# Patient Record
Sex: Female | Born: 1960 | Race: White | Hispanic: No | Marital: Single | Smoking: Current every day smoker
Health system: Southern US, Community
[De-identification: ages and names within clinical notes are randomized; demographics above are authoritative.]

## PROBLEM LIST (undated history)

## (undated) DIAGNOSIS — E785 Hyperlipidemia, unspecified: Secondary | ICD-10-CM

## (undated) DIAGNOSIS — E079 Disorder of thyroid, unspecified: Secondary | ICD-10-CM

## (undated) DIAGNOSIS — T7840XA Allergy, unspecified, initial encounter: Secondary | ICD-10-CM

## (undated) DIAGNOSIS — J45909 Unspecified asthma, uncomplicated: Secondary | ICD-10-CM

## (undated) DIAGNOSIS — F419 Anxiety disorder, unspecified: Secondary | ICD-10-CM

## (undated) HISTORY — DX: Hyperlipidemia, unspecified: E78.5

## (undated) HISTORY — DX: Disorder of thyroid, unspecified: E07.9

## (undated) HISTORY — PX: MOUTH SURGERY: SHX715

## (undated) HISTORY — DX: Anxiety disorder, unspecified: F41.9

## (undated) HISTORY — DX: Unspecified asthma, uncomplicated: J45.909

## (undated) HISTORY — DX: Allergy, unspecified, initial encounter: T78.40XA

---

## 2010-07-21 ENCOUNTER — Encounter: Admission: RE | Admit: 2010-07-21 | Discharge: 2010-07-21 | Payer: Self-pay | Admitting: Internal Medicine

## 2010-07-25 ENCOUNTER — Encounter: Admission: RE | Admit: 2010-07-25 | Discharge: 2010-07-25 | Payer: Self-pay | Admitting: Internal Medicine

## 2011-06-21 ENCOUNTER — Other Ambulatory Visit: Payer: Self-pay | Admitting: Emergency Medicine

## 2011-06-21 DIAGNOSIS — Z1231 Encounter for screening mammogram for malignant neoplasm of breast: Secondary | ICD-10-CM

## 2011-07-23 ENCOUNTER — Ambulatory Visit
Admission: RE | Admit: 2011-07-23 | Discharge: 2011-07-23 | Disposition: A | Discharge status: Home / Self Care | Payer: BC Managed Care – PPO | Source: Ambulatory Visit | Attending: Emergency Medicine | Admitting: Emergency Medicine

## 2011-07-23 DIAGNOSIS — Z1231 Encounter for screening mammogram for malignant neoplasm of breast: Secondary | ICD-10-CM

## 2011-07-25 ENCOUNTER — Other Ambulatory Visit: Payer: Self-pay | Admitting: Emergency Medicine

## 2011-07-25 DIAGNOSIS — R928 Other abnormal and inconclusive findings on diagnostic imaging of breast: Secondary | ICD-10-CM

## 2011-08-01 ENCOUNTER — Ambulatory Visit
Admission: RE | Admit: 2011-08-01 | Discharge: 2011-08-01 | Disposition: A | Discharge status: Home / Self Care | Payer: BC Managed Care – PPO | Source: Ambulatory Visit | Attending: Emergency Medicine | Admitting: Emergency Medicine

## 2011-08-01 DIAGNOSIS — R928 Other abnormal and inconclusive findings on diagnostic imaging of breast: Secondary | ICD-10-CM

## 2012-02-03 ENCOUNTER — Ambulatory Visit (INDEPENDENT_AMBULATORY_CARE_PROVIDER_SITE_OTHER): Payer: BC Managed Care – PPO | Admitting: Physician Assistant

## 2012-02-03 VITALS — BP 131/82 | HR 76 | Temp 98.5°F | Resp 16 | Ht 66.0 in | Wt 158.0 lb

## 2012-02-03 DIAGNOSIS — Z23 Encounter for immunization: Secondary | ICD-10-CM

## 2012-02-03 DIAGNOSIS — J301 Allergic rhinitis due to pollen: Secondary | ICD-10-CM

## 2012-02-03 DIAGNOSIS — J45909 Unspecified asthma, uncomplicated: Secondary | ICD-10-CM

## 2012-02-03 MED ORDER — BECLOMETHASONE DIPROPIONATE 80 MCG/ACT IN AERS: 1.0000 | INHALATION_SPRAY | Freq: Two times a day (BID) | RESPIRATORY_TRACT | Status: DC

## 2012-02-03 NOTE — Patient Instructions (Signed)
Use Pro Air regularly Add QVar 80. Call if symptoms worsen

## 2012-02-03 NOTE — Progress Notes (Signed)
  Subjective:    Patient ID: Julie Nixon, female    DOB: 07-01-1961, 51 y.o.   MRN: [id number]  HPI Julie Nixon comes in today with 2 days of head congestion and significant rhinorrhea.  Has used her regular allergy meds which has helped.  Concerned it will settle in her chest and flare her asthma. No current wheezing or SOB Restarted smoking in Dec.  Smoking 1/2 ppd  Also, needs #3 Hep B to complete series   Review of Systems  Constitutional: Negative for fever and fatigue.  HENT: Positive for congestion, rhinorrhea, sneezing and postnasal drip. Negative for sore throat.   Respiratory: Positive for cough. Negative for chest tightness.   Musculoskeletal: Negative for myalgias.  Neurological: Positive for headaches.       Objective:   Physical Exam  Constitutional: She appears well-developed and well-nourished.  HENT:  Right Ear: Tympanic membrane normal.  Left Ear: Tympanic membrane normal.  Nose: Mucosal edema and rhinorrhea present.  Mouth/Throat: Oropharynx is clear and moist.  Cardiovascular: Normal rate and regular rhythm.   Pulmonary/Chest: Effort normal and breath sounds normal.          Assessment & Plan:   1. Asthma  beclomethasone (QVAR) 80 MCG/ACT inhaler  2. Need for prophylactic vaccination and inoculation against viral hepatitis  Hepatitis B vaccine adult IM  3. Allergic rhinitis due to pollen     Call if symptoms worsen

## 2012-03-06 ENCOUNTER — Ambulatory Visit (INDEPENDENT_AMBULATORY_CARE_PROVIDER_SITE_OTHER): Payer: BC Managed Care – PPO | Admitting: Physician Assistant

## 2012-03-06 VITALS — BP 160/82 | HR 70 | Temp 98.1°F | Resp 16 | Ht 66.0 in | Wt 158.0 lb

## 2012-03-06 DIAGNOSIS — J019 Acute sinusitis, unspecified: Secondary | ICD-10-CM

## 2012-03-06 DIAGNOSIS — R509 Fever, unspecified: Secondary | ICD-10-CM

## 2012-03-06 LAB — POCT CBC
Granulocyte percent: 41.5 %G (ref 37–80)
HCT, POC: 39.3 % (ref 37.7–47.9)
Hemoglobin: 12.6 g/dL (ref 12.2–16.2)
MPV: 9.5 fL (ref 0–99.8)
POC Granulocyte: 3.8 (ref 2–6.9)
POC LYMPH PERCENT: 49.5 %L (ref 10–50)
POC MID %: 9 %M (ref 0–12)
RBC: 4.24 M/uL (ref 4.04–5.48)
RDW, POC: 12.4 %
WBC: 9.2 10*3/uL (ref 4.6–10.2)

## 2012-03-06 MED ORDER — AMOXICILLIN 875 MG PO TABS: ORAL_TABLET | ORAL | Status: AC

## 2012-03-06 NOTE — Progress Notes (Signed)
  Subjective:    Patient ID: Julie Nixon, female    DOB: 1961-01-15, 51 y.o.   MRN: [id number]  HPI 4 days of fatigue, runny nose with thick drainage with neti pot.  Feverish last night with sweats this AM. Myalgias and a little cough.  Some wheezing but she is using her inhalers which is helping.  Review of Systems  Constitutional: Positive for fever, chills and fatigue.  HENT: Positive for congestion and rhinorrhea. Negative for ear pain and sore throat.   Respiratory: Positive for cough and wheezing. Negative for shortness of breath.   Musculoskeletal: Positive for myalgias.  Neurological: Positive for light-headedness. Negative for headaches.       Objective:   Physical Exam  HENT:  Right Ear: Tympanic membrane normal.  Left Ear: Tympanic membrane normal.  Nose: Mucosal edema and rhinorrhea present.  Mouth/Throat: Oropharynx is clear and moist.  Cardiovascular: Normal rate and regular rhythm.   Pulmonary/Chest: Effort normal and breath sounds normal.  Skin: Skin is warm and dry.     Results for orders placed in visit on 03/06/12  POCT CBC      Component Value Range   WBC 9.2  4.6 - 10.2 (K/uL)   Lymph, poc 4.6 (*) 0.6 - 3.4    POC LYMPH PERCENT 49.5  10 - 50 (%L)   MID (cbc) 0.8  0 - 0.9    POC MID % 9.0  0 - 12 (%M)   POC Granulocyte 3.8  2 - 6.9    Granulocyte percent 41.5  37 - 80 (%G)   RBC 4.24  4.04 - 5.48 (M/uL)   Hemoglobin 12.6  12.2 - 16.2 (g/dL)   HCT, POC 08.6  57.8 - 47.9 (%)   MCV 92.6  80 - 97 (fL)   MCH, POC 29.7  27 - 31.2 (pg)   MCHC 32.1  31.8 - 35.4 (g/dL)   RDW, POC 46.9     Platelet Count, POC 239  142 - 424 (K/uL)   MPV 9.5  0 - 99.8 (fL)       Assessment & Plan:  Sinusitis  Amoxicillin, use nasal sprays Call if sx worsen

## 2012-07-08 ENCOUNTER — Ambulatory Visit (INDEPENDENT_AMBULATORY_CARE_PROVIDER_SITE_OTHER): Payer: BC Managed Care – PPO | Admitting: Physician Assistant

## 2012-07-08 ENCOUNTER — Encounter: Payer: Self-pay | Admitting: Physician Assistant

## 2012-07-08 VITALS — BP 126/79 | HR 70 | Temp 97.1°F | Resp 16 | Ht 66.0 in | Wt 152.0 lb

## 2012-07-08 DIAGNOSIS — Z0000 Encounter for general adult medical examination without abnormal findings: Secondary | ICD-10-CM

## 2012-07-08 LAB — CBC WITH DIFFERENTIAL/PLATELET
Basophils Absolute: 0.1 10*3/uL (ref 0.0–0.1)
Basophils Relative: 1 % (ref 0–1)
HCT: 43.2 % (ref 36.0–46.0)
MCH: 31.3 pg (ref 26.0–34.0)
MCV: 90.2 fL (ref 78.0–100.0)
Monocytes Absolute: 0.6 10*3/uL (ref 0.1–1.0)
Monocytes Relative: 10 % (ref 3–12)
RBC: 4.79 MIL/uL (ref 3.87–5.11)
RDW: 13.2 % (ref 11.5–15.5)
WBC: 5.6 10*3/uL (ref 4.0–10.5)

## 2012-07-08 LAB — POCT URINALYSIS DIPSTICK
Blood, UA: NEGATIVE
Glucose, UA: NEGATIVE
Leukocytes, UA: NEGATIVE
Protein, UA: 30
Urobilinogen, UA: 0.2

## 2012-07-08 NOTE — Progress Notes (Signed)
  Subjective:    Patient ID: Julie Nixon, female    DOB: 04-08-1961, 51 y.o.   MRN: [id number]  HPI Mckensi comes in today for her annual exam.  She has no specific concerns.  She would like to try the electronic cigarette to help with smoking cessation.  She has lost weight and is pleased with her efforts with diet changes and exercise.  She mentions that she had a period for the first time since November in June 2013.  She had 3 cycles in 2012.  She has not scheduled her MMG yet but is due.     Review of Systems  Constitutional: Negative.   HENT: Negative.   Eyes: Negative.   Respiratory: Negative.   Cardiovascular: Negative.   Gastrointestinal: Negative.   Genitourinary: Positive for vaginal bleeding.  Musculoskeletal: Negative.   Neurological: Negative.   Psychiatric/Behavioral: Negative.        Objective:   Physical Exam  Constitutional: She is oriented to person, place, and time. Vital signs are normal. She appears well-developed and well-nourished.  HENT:  Right Ear: Tympanic membrane normal.  Left Ear: Tympanic membrane normal.  Mouth/Throat: Oropharynx is clear and moist.  Eyes: Conjunctivae, EOM and lids are normal. Pupils are equal, round, and reactive to light.  Neck: Normal range of motion. No mass and no thyromegaly present.  Cardiovascular: Normal rate and regular rhythm.   Pulmonary/Chest: Effort normal and breath sounds normal.  Abdominal: Normal appearance and bowel sounds are normal. There is no tenderness.  Genitourinary: Vagina normal and uterus normal. No breast swelling, tenderness, discharge or bleeding. Cervix exhibits no motion tenderness and no discharge. Right adnexum displays no mass, no tenderness and no fullness. Left adnexum displays no mass, no tenderness and no fullness.  Lymphadenopathy:    She has no cervical adenopathy.    She has no axillary adenopathy.  Neurological: She is alert and oriented to person, place, and time. She has normal  strength and normal reflexes.  Skin: Skin is warm.  Psychiatric: She has a normal mood and affect.          Assessment & Plan:  Annual Screening Exam Nicotine Addicition Allergic Rhinitis Asthma  CBC, CMET, Lipid, TSH, Pap #1, Vit D Encouraged her to continue lifestyle changes Encouraged smoking cessation She [facility]

## 2012-07-09 ENCOUNTER — Other Ambulatory Visit: Payer: Self-pay | Admitting: Emergency Medicine

## 2012-07-09 DIAGNOSIS — Z1231 Encounter for screening mammogram for malignant neoplasm of breast: Secondary | ICD-10-CM

## 2012-07-09 LAB — COMPREHENSIVE METABOLIC PANEL
AST: 18 U/L (ref 0–37)
Alkaline Phosphatase: 49 U/L (ref 39–117)
BUN: 11 mg/dL (ref 6–23)
CO2: 25 mEq/L (ref 19–32)
Creat: 0.74 mg/dL (ref 0.50–1.10)
Glucose, Bld: 86 mg/dL (ref 70–99)
Total Bilirubin: 0.5 mg/dL (ref 0.3–1.2)
Total Protein: 6.8 g/dL (ref 6.0–8.3)

## 2012-07-09 LAB — TSH: TSH: 1.528 u[IU]/mL (ref 0.350–4.500)

## 2012-07-09 LAB — LIPID PANEL
Cholesterol: 264 mg/dL — ABNORMAL HIGH (ref 0–200)
Triglycerides: 126 mg/dL (ref <150)
VLDL: 25 mg/dL (ref 0–40)

## 2012-07-10 LAB — PAP IG (IMAGE GUIDED)

## 2012-07-16 ENCOUNTER — Telehealth: Payer: Self-pay

## 2012-07-16 MED ORDER — ATORVASTATIN CALCIUM 10 MG PO TABS: ORAL_TABLET | ORAL | Status: DC

## 2012-07-16 NOTE — Telephone Encounter (Signed)
Pt CB after receiving an unable to reach letter about her labs. Discussed labs and instr's to pt and updated her contact info. Pt agreed to try Lipitor and RTC for lipid recheck in 6-8 weeks. Sent in Rx for Lipitor 10 as written in lab results by [place] to [employer] and [facility].

## 2012-07-21 ENCOUNTER — Telehealth: Payer: Self-pay

## 2012-07-21 NOTE — Telephone Encounter (Signed)
Patient has already spoken to someone about her labs, but she would like a copy of her labs mailed to her.

## 2012-07-21 NOTE — Telephone Encounter (Signed)
Mailed pt copy of her labs 

## 2012-08-04 ENCOUNTER — Ambulatory Visit
Admission: RE | Admit: 2012-08-04 | Discharge: 2012-08-04 | Disposition: A | Discharge status: Home / Self Care | Payer: BC Managed Care – PPO | Source: Ambulatory Visit | Attending: Emergency Medicine | Admitting: Emergency Medicine

## 2012-08-04 DIAGNOSIS — Z1231 Encounter for screening mammogram for malignant neoplasm of breast: Secondary | ICD-10-CM

## 2012-08-06 ENCOUNTER — Telehealth: Payer: Self-pay

## 2012-08-06 NOTE — Telephone Encounter (Signed)
Given her family history, getting her cholesterol down is very important.    D/C the lipitor x 2 weeks.  If the myalgias resolve, restart the lipitor.  If the myalgias recur, then D/C and call back for instructions.  If the myalgias do not resolve, RTC to evaluate the cause.  We can try other cholesterol lowering medications, and OTC fish oil as well if needed.

## 2012-08-06 NOTE — Telephone Encounter (Signed)
Pt started Lipitor 10 mg on 7/17 and now she is having soreness with her muscles, using [facility] [address]. Wants to know if she can take suppliments instead. I told her it depends on what her lab values are but I will call back.

## 2012-08-06 NOTE — Telephone Encounter (Signed)
The patient would like to have return call to discuss side effects of Lipitor.  The patient states she is have muscle pain and soreness.  Please call the patient to discuss symptoms at [phone].

## 2012-08-06 NOTE — Telephone Encounter (Signed)
Spoke to her and she was advised.

## 2012-09-03 ENCOUNTER — Ambulatory Visit (INDEPENDENT_AMBULATORY_CARE_PROVIDER_SITE_OTHER): Payer: BC Managed Care – PPO | Admitting: Physician Assistant

## 2012-09-03 VITALS — BP 137/82 | HR 81 | Temp 98.0°F | Resp 16 | Ht 66.5 in | Wt 151.0 lb

## 2012-09-03 DIAGNOSIS — R454 Irritability and anger: Secondary | ICD-10-CM

## 2012-09-03 DIAGNOSIS — N951 Menopausal and female climacteric states: Secondary | ICD-10-CM

## 2012-09-03 MED ORDER — CLONAZEPAM 0.5 MG PO TABS: 0.2500 mg | ORAL_TABLET | Freq: Two times a day (BID) | ORAL | Status: DC | PRN

## 2012-09-03 MED ORDER — VENLAFAXINE HCL ER 37.5 MG PO CP24: 37.5000 mg | ORAL_CAPSULE | Freq: Every day | ORAL | Status: DC

## 2012-09-03 NOTE — Progress Notes (Signed)
  Subjective:    Patient ID: Julie Nixon, female    DOB: 13-Mar-1961, 51 y.o.   MRN: [id number]  HPI Pt presents to clinic with irritability.  She has been having problems with irritability for a while but today it really got bad.  She feels like this is all related to menopause. She has not had a period since May.  She is short tempered, tearful and emotional which is not normal for her.  She is having hot flashes and waking up at night drenched in sweat but she is still sleeping ok.  She feels like she is a really strong person but this is getting to her.  She forgets things that she has done forever and that makes her really mad.  She has dealt with this for months but she just feels like she needs some medication help.  Review of Systems  Psychiatric/Behavioral: Positive for disturbed wake/sleep cycle (vasomotor night sweats) and decreased concentration. Negative for self-injury. The patient is nervous/anxious.        Objective:   Physical Exam  Constitutional: She is oriented to person, place, and time. She appears well-developed and well-nourished.       Tearful at times  HENT:  Head: Normocephalic and atraumatic.  Right Ear: External ear normal.  Left Ear: External ear normal.  Eyes: Conjunctivae are normal.  Pulmonary/Chest: Effort normal.  Neurological: She is alert and oriented to person, place, and time.  Skin: Skin is warm and dry.  Psychiatric: She has a normal mood and affect. Her behavior is normal. Judgment and thought content normal.          Assessment & Plan:   1. Menopausal syndrome   2. Irritable mood   3. Hot flashes, menopausal    Spent 30 minutes with patient discussing her current symptoms.  Pt very hesitant for hormonal therapy due to strong family hx of cardiac disease.  D/w pt her options and we felt like Effexor with rescue meds for anxiety might be the best treatment.  Pt will RTC in 1 month for recheck of both irritability and cholesterol.  At  that time we may have to increase her Effexor dose.  She will use Klonopin prn and hope to be off of that in several months.

## 2012-10-11 ENCOUNTER — Ambulatory Visit (INDEPENDENT_AMBULATORY_CARE_PROVIDER_SITE_OTHER): Payer: BC Managed Care – PPO | Admitting: Emergency Medicine

## 2012-10-11 ENCOUNTER — Other Ambulatory Visit: Payer: BC Managed Care – PPO

## 2012-10-11 ENCOUNTER — Other Ambulatory Visit: Payer: Self-pay | Admitting: Emergency Medicine

## 2012-10-11 VITALS — BP 124/87 | HR 60 | Temp 97.5°F | Resp 16 | Ht 66.5 in | Wt 149.0 lb

## 2012-10-11 DIAGNOSIS — E785 Hyperlipidemia, unspecified: Secondary | ICD-10-CM

## 2012-10-11 LAB — COMPREHENSIVE METABOLIC PANEL
Albumin: 4.4 g/dL (ref 3.5–5.2)
Alkaline Phosphatase: 47 U/L (ref 39–117)
BUN: 12 mg/dL (ref 6–23)
Calcium: 9.3 mg/dL (ref 8.4–10.5)
Chloride: 103 mEq/L (ref 96–112)
Creat: 0.73 mg/dL (ref 0.50–1.10)
Glucose, Bld: 87 mg/dL (ref 70–99)
Total Protein: 6.7 g/dL (ref 6.0–8.3)

## 2012-10-11 LAB — LIPID PANEL
Cholesterol: 139 mg/dL (ref 0–200)
Total CHOL/HDL Ratio: 3 Ratio

## 2012-10-11 NOTE — Progress Notes (Signed)
[facility] [address] [place] [address] [phone]  Date:  10/11/2012   Name:  Julie Nixon   DOB:  1961/09/28   MRN:  [id number]  PCP:  Tally Due, MD    Chief Complaint: Follow-up   History of Present Illness:  Julie Nixon is a 51 y.o. very pleasant female patient who presents with the following: she is here for labs, needs lipid panel for Lipitor. Had myalgia from this medication, d/c for 2 weeks. Then resumed medication. Myalgias have not returned. States she feels fine.     There is no problem list on file for this patient.   No past medical history on file.  No past surgical history on file.  History  Substance Use Topics  . Smoking status: Former Smoker -- 1.0 packs/day for 31 years    Types: Cigarettes    Quit date: 08/06/2012  . Smokeless tobacco: Not on file   Comment: trying to quit  . Alcohol Use: No    No family history on file.  No Known Allergies  Medication list has been reviewed and updated.  Current Outpatient Prescriptions on File Prior to Visit  Medication Sig Dispense Refill  . atorvastatin (LIPITOR) 10 MG tablet Take one tablet by mouth every night.  90 tablet  0  . fexofenadine-pseudoephedrine (ALLEGRA-D 24) 180-240 MG per 24 hr tablet Take 1 tablet by mouth daily.      Marland Kitchen venlafaxine XR (EFFEXOR XR) 37.5 MG 24 hr capsule Take 1 capsule (37.5 mg total) by mouth daily.  30 capsule  0  . azelastine (ASTELIN) 137 MCG/SPRAY nasal spray Place 1 spray into the nose daily. Use in each nostril as directed      . beclomethasone (QVAR) 80 MCG/ACT inhaler Inhale 1 puff into the lungs 2 (two) times daily.  1 Inhaler  2  . clonazePAM (KLONOPIN) 0.5 MG tablet Take 0.5-1 tablets (0.25-0.5 mg total) by mouth 2 (two) times daily as needed for anxiety.  30 tablet  0  . fluticasone (FLONASE) 50 MCG/ACT nasal spray Place 2 sprays into the nose daily.        Review of Systems:  As per HPI, otherwise negative.      Physical Examination: Filed Vitals:   10/11/12 0930  BP: 124/87  Pulse: 60  Temp: 97.5 F (36.4 C)  Resp: 16   Filed Vitals:   10/11/12 0930  Height: 5' 6.5" (1.689 m)  Weight: 149 lb (67.586 kg)   Body mass index is 23.69 kg/(m^2). Ideal Body Weight: Weight in (lb) to have BMI = 25: 156.9    GEN: WDWN, NAD, Non-toxic, Alert & Oriented x 3 HEENT: Atraumatic, Normocephalic.  Ears and Nose: No external deformity. EXTR: No clubbing/cyanosis/edema NEURO: Normal gait.  PSYCH: Normally interactive. Conversant. Not depressed or anxious appearing.  Calm demeanor.    Assessment and Plan: Elevated cholesterol Labs Reviewed non-pharmaceutic measures to reduce lipid levels Follow up in six months Offered referral to 104 and declined.  Littrell, Amy Wall, RT  I have reviewed and agree with documentation. Robert P. Merla Riches, M.D.

## 2012-10-12 ENCOUNTER — Other Ambulatory Visit: Payer: Self-pay | Admitting: Physician Assistant

## 2012-10-13 ENCOUNTER — Other Ambulatory Visit: Payer: Self-pay | Admitting: Emergency Medicine

## 2012-10-13 MED ORDER — ATORVASTATIN CALCIUM 40 MG PO TABS: 40.0000 mg | ORAL_TABLET | Freq: Every day | ORAL | Status: DC

## 2012-10-14 ENCOUNTER — Telehealth: Payer: Self-pay | Admitting: Family Medicine

## 2012-10-14 NOTE — Telephone Encounter (Signed)
(  See labs) Spoke to pt and read message Dr Dareen Piano wrote, gave her results and she did not understand at all why this was necessary. So I reviewed the labs-and range- and I told her that I would need to speak with PA/ or Anderson in regards to this considering her #'s came back in a normal range setting.

## 2012-10-15 ENCOUNTER — Encounter: Payer: Self-pay | Admitting: Emergency Medicine

## 2012-10-15 NOTE — Telephone Encounter (Signed)
Pt notified and copy sent 

## 2012-10-15 NOTE — Telephone Encounter (Signed)
Labs are normal no need for further action.  Thanks.  Please let patient know

## 2012-10-21 ENCOUNTER — Other Ambulatory Visit: Payer: Self-pay | Admitting: *Deleted

## 2012-10-21 MED ORDER — ATORVASTATIN CALCIUM 10 MG PO TABS: 10.0000 mg | ORAL_TABLET | Freq: Every day | ORAL | Status: DC

## 2012-11-15 ENCOUNTER — Other Ambulatory Visit: Payer: Self-pay | Admitting: Physician Assistant

## 2012-12-10 ENCOUNTER — Ambulatory Visit (INDEPENDENT_AMBULATORY_CARE_PROVIDER_SITE_OTHER): Payer: BC Managed Care – PPO | Admitting: Family Medicine

## 2012-12-10 VITALS — BP 166/83 | HR 72 | Temp 97.9°F | Resp 16 | Ht 67.0 in | Wt 160.2 lb

## 2012-12-10 DIAGNOSIS — J309 Allergic rhinitis, unspecified: Secondary | ICD-10-CM

## 2012-12-10 DIAGNOSIS — L259 Unspecified contact dermatitis, unspecified cause: Secondary | ICD-10-CM

## 2012-12-10 MED ORDER — METHYLPREDNISOLONE ACETATE 80 MG/ML IJ SUSP
80.0000 mg | Freq: Once | INTRAMUSCULAR | Status: AC
  Administered 2012-12-10: 80 mg via INTRAMUSCULAR

## 2012-12-10 NOTE — Patient Instructions (Signed)

## 2012-12-10 NOTE — Progress Notes (Signed)
Chief complaint: Worsening allergies as well as spot on back.  History of present illness: 51 year old female with significant past medical history of seasonal allergies having worsening allergies recently. 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 Patient states that she's been taking her Allegra-D as well as a nasal spray a regular basis. Patient states that she had worsening rhinorrhea as well as a rash that occurred on her nose patient redness on her face as well. Patient denies any shortness of breath any chest pain any palpitations fevers chills or recent illnesses.   in addition this patient also has an area on her lower back she would like looked at. Patient states that this has popped up as well over the course of the last few days. Patient states that she says it itches and once again denies any warmness any discharge and denies any radiation of the pain.  Past medical history, social, surgical and family history all reviewed.   Denies fever, chills, nausea vomiting abdominal pain, dysuria, chest pain, shortness of breath dyspnea on exertion or numbness in extremities  Physical exam Blood pressure 166/83, pulse 72, temperature 97.9 F (36.6 C), temperature source Oral, resp. rate 16, height 5\' 7"  (1.702 m), weight 160 lb 3.2 oz (72.666 kg), last menstrual period 05/03/2012, SpO2 99.00%. General: Patient does have some mild pressure speech. No apparent distress Respiratory: Clear to auscultation bilaterally Cardiovascular: Regular in rhythm no murmur Abdomen: Bowel sounds positive nontender nondistended Extremities: Capillary refill brisk, she is neurovascularly intact in all activities. Back exam: Patient skin does have a area that corresponds to we'll in his mid back. There is also some dry eczema that she has on her arms in the flexor regions. On patient's face she does have some mild macular areas underneath her  nose but there is no oral mucosa involvement.  Assessment: Allergic rhinitis with exacerbation Eczema Pressured speech  Plan: Patient was given a shot of 80 mg of Depo-Medrol today. Patient will continue to take her antihistamine for a regular basis. For her eczema patient was given handouts and told to use Aveeno or Eucerin on a regular basis twice a day. Encourage her to monitor diet and likely secondary to her allergic rhinitis as well. Discuss with her pressured speech and being somewhat in a potential hypomanic state. Patient has been taking Effexor for the last 3 months at a very low dose for menopausal symptoms. Patient will followup with primary care provider to discuss if this seems to be getting a way of her regular activities. Patient's screening exam today was negative though for bipolar.

## 2012-12-20 ENCOUNTER — Telehealth: Payer: Self-pay

## 2012-12-20 ENCOUNTER — Other Ambulatory Visit: Payer: Self-pay | Admitting: Physician Assistant

## 2012-12-20 NOTE — Telephone Encounter (Signed)
The patient called to request refill of her Effexor 37.5 mg rx.  The patient stated she asked her pharmacy to refill the medication on 12/14/12 and they stated last night (12/19/12) that they had no response from [facility].  The patient stated she is almost out of her medication.  Please call the patient to update on status at [phone].

## 2012-12-21 MED ORDER — VENLAFAXINE HCL ER 37.5 MG PO CP24: 37.5000 mg | ORAL_CAPSULE | Freq: Every day | ORAL | Status: DC

## 2012-12-21 NOTE — Telephone Encounter (Signed)
Refills sent. Notify patient.

## 2012-12-21 NOTE — Telephone Encounter (Signed)
Pt is calling checking on status of her refill request the original was sent to [facility] on 12/14/12 per patient

## 2012-12-21 NOTE — Telephone Encounter (Signed)
lmom that rx has been sent into pharmacy

## 2013-02-03 ENCOUNTER — Ambulatory Visit (INDEPENDENT_AMBULATORY_CARE_PROVIDER_SITE_OTHER): Payer: BC Managed Care – PPO | Admitting: Physician Assistant

## 2013-02-03 VITALS — BP 144/80 | HR 75 | Temp 98.0°F | Resp 17 | Ht 66.5 in | Wt 163.0 lb

## 2013-02-03 DIAGNOSIS — M25462 Effusion, left knee: Secondary | ICD-10-CM

## 2013-02-03 DIAGNOSIS — M25469 Effusion, unspecified knee: Secondary | ICD-10-CM

## 2013-02-03 DIAGNOSIS — R21 Rash and other nonspecific skin eruption: Secondary | ICD-10-CM

## 2013-02-03 MED ORDER — TRIAMCINOLONE ACETONIDE 0.5 % EX CREA: TOPICAL_CREAM | Freq: Two times a day (BID) | CUTANEOUS | Status: DC

## 2013-02-03 NOTE — Progress Notes (Signed)
[address], [place] [address]   Phone [phone]  Subjective:    Patient ID: Julie Nixon, female    DOB: 1961-05-22, 52 y.o.   MRN: [id number]  HPI Pt presents to clinic with 2 concerns 1- L knee feels tight since yesterday when she squatted down and it felt funny.  She did not feel a pop,click or grind and has had no pain since it occurred.  Does feel like her knee is a little swollen.  She has never had a knee injury.  She has taken no medications or done ice, she just wants to make sure it is ok.  She is about 15 heavier than she was last year and is trying to get that down - she has had no change in her diet but is suffering from menopausal symptoms 2- area on her back that she has had for months that is itchy raised and red - she does not know what caused it and does not have it anywhere else - she has used from OTC hydrocortisone cream every once in a while.  She has h/o severe allergies and skin irritation.  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   Review of Systems  Constitutional: Negative for fever and chills.  Musculoskeletal: Positive for joint swelling (L knee without injury). Negative for gait problem.  Skin: Positive for rash.       Objective:   Physical Exam  Vitals reviewed. Constitutional: She is oriented to person, place, and time. She appears well-developed and well-nourished.  HENT:  Head: Normocephalic and atraumatic.  Right Ear: External ear normal.  Left Ear: External ear normal.  Pulmonary/Chest: Effort normal.  Musculoskeletal:       Left knee: She exhibits swelling and effusion. She exhibits normal range of motion, no ecchymosis, no deformity, no laceration, no erythema and no LCL laxity. no tenderness found. No medial joint line, no lateral joint line, no MCL, no LCL  and no patellar tendon tenderness noted.       Slight swelling noticed around patella.  Otherwise normal exam.  Neurological: She is alert and oriented to person, place, and time.  Skin: Skin is warm and dry. Rash noted.       Rash over vertebral bony prominence that sticks out the most above waist line and below bra line.  Multiple erythematous papules that are excoriated.  No signs of infection.  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  Psychiatric: She has a normal mood and affect. Her behavior is normal. Judgment and thought content normal.      Assessment & Plan:   1. Rash of back  triamcinolone cream (KENALOG) 0.5 %  2. 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 2- pt will watch and wait and use OTC NSAIDs - due to no bony tenderness and no pain will not do an xray at this time.  Pt to use ice and not heat.  Probably related to full knee extension and increased weight recently.  She can continue to workout as long as she is not load bearing on her knees.

## 2013-05-16 ENCOUNTER — Ambulatory Visit (INDEPENDENT_AMBULATORY_CARE_PROVIDER_SITE_OTHER): Payer: BC Managed Care – PPO | Admitting: Emergency Medicine

## 2013-05-16 VITALS — BP 118/86 | HR 68 | Temp 98.0°F | Resp 16 | Ht 67.5 in | Wt 165.8 lb

## 2013-05-16 DIAGNOSIS — F17200 Nicotine dependence, unspecified, uncomplicated: Secondary | ICD-10-CM

## 2013-05-16 DIAGNOSIS — R5383 Other fatigue: Secondary | ICD-10-CM

## 2013-05-16 DIAGNOSIS — E785 Hyperlipidemia, unspecified: Secondary | ICD-10-CM

## 2013-05-16 DIAGNOSIS — IMO0001 Reserved for inherently not codable concepts without codable children: Secondary | ICD-10-CM

## 2013-05-16 DIAGNOSIS — Z79899 Other long term (current) drug therapy: Secondary | ICD-10-CM

## 2013-05-16 DIAGNOSIS — R5381 Other malaise: Secondary | ICD-10-CM

## 2013-05-16 LAB — LIPID PANEL
Cholesterol: 161 mg/dL (ref 0–200)
HDL: 59 mg/dL (ref >39)
Total CHOL/HDL Ratio: 2.7 Ratio
Triglycerides: 86 mg/dL (ref <150)

## 2013-05-16 LAB — COMPREHENSIVE METABOLIC PANEL
Alkaline Phosphatase: 48 U/L (ref 39–117)
BUN: 14 mg/dL (ref 6–23)
CO2: 27 mEq/L (ref 19–32)
Creat: 0.67 mg/dL (ref 0.50–1.10)
Glucose, Bld: 89 mg/dL (ref 70–99)
Total Bilirubin: 0.6 mg/dL (ref 0.3–1.2)

## 2013-05-16 LAB — POCT CBC
Granulocyte percent: 50.7 %G (ref 37–80)
MCH, POC: 31 pg (ref 27–31.2)
MCHC: 32.2 g/dL (ref 31.8–35.4)
MID (cbc): 0.6 (ref 0–0.9)
MPV: 8.9 fL (ref 0–99.8)
POC LYMPH PERCENT: 39.1 %L (ref 10–50)
POC MID %: 10.2 %M (ref 0–12)
RBC: 4.78 M/uL (ref 4.04–5.48)
RDW, POC: 11.9 %

## 2013-05-16 LAB — T4, FREE: Free T4: 1.78 ng/dL (ref 0.80–1.80)

## 2013-05-16 LAB — TSH: TSH: 0.091 u[IU]/mL — ABNORMAL LOW (ref 0.350–4.500)

## 2013-05-16 LAB — CK: Total CK: 87 U/L (ref 7–177)

## 2013-05-16 NOTE — Progress Notes (Signed)
  Subjective:    Patient ID: Julie Nixon, female    DOB: Jan 20, 1961, 52 y.o.   MRN: [id number]  HPI  52 year old female here with soreness all over but not all the time primarily in the am.  Taking ibuprofen and it eases it up.  Has a lot of weight gain.  Thinking it is medicine for hot flashes, effexor.  Was on lipitor in July and started feeling bad.  Went off of it for two weeks and felt better.  Went back on it and didn't have trouble until about a month ago.  Muscle pain is not as severe as it was in the beginning.  Been off of cigarettes for nine months.  Been doing the e cigarette.  Started oil pulling, rinse mouth 20 minutes a day with oil for allergies.  Concerned about cholesterol.      Review of Systems     Objective:   Physical Exam HEENT exam is unremarkable. Neck is supple. Thyroid is not enlarged. Chest is clear to both auscultation and percussion. Cardiac exam reveals a regular rate without murmurs rubs or gallops. Abdomen is soft nontender  Results for orders placed in visit on 05/16/13  POCT CBC      Result Value Range   WBC 5.9  4.6 - 10.2 K/uL   Lymph, poc 2.3  0.6 - 3.4   POC LYMPH PERCENT 39.1  10 - 50 %L   MID (cbc) 0.6  0 - 0.9   POC MID % 10.2  0 - 12 %M   POC Granulocyte 3.0  2 - 6.9   Granulocyte percent 50.7  37 - 80 %G   RBC 4.78  4.04 - 5.48 M/uL   Hemoglobin 14.8  12.2 - 16.2 g/dL   HCT, POC 65.7  84.6 - 47.9 %   MCV 96.3  80 - 97 fL   MCH, POC 31.0  27 - 31.2 pg   MCHC 32.2  31.8 - 35.4 g/dL   RDW, POC 96.2     Platelet Count, POC 264  142 - 424 K/uL   MPV 8.9  0 - 99.8 fL        Assessment & Plan:  Communication on her last visit regarding her Lipitor. I think we will go ahead and check her lipids today and see where she stands with this. She's concerned about weight gain but has been able to stop smoking. I'm going ahead and decreasing her Lipitor by half if her lipid panel is reasonable .

## 2013-05-17 ENCOUNTER — Telehealth: Payer: Self-pay

## 2013-05-17 NOTE — Telephone Encounter (Signed)
Pt called and stated that Dr. Cleta Alberts did not send in her rx's for Astelin, albuterol, and Effexor. She will be out of the Effexor tom evening.  Dr. Cleta Alberts is out of the office tom 05/17/13 but can we refill these for her.  She was seen on 05/16/13

## 2013-05-18 MED ORDER — AZELASTINE HCL 0.1 % NA SOLN: 1.0000 | Freq: Every day | NASAL | Status: DC

## 2013-05-18 MED ORDER — ALBUTEROL SULFATE HFA 108 (90 BASE) MCG/ACT IN AERS: 2.0000 | INHALATION_SPRAY | Freq: Four times a day (QID) | RESPIRATORY_TRACT | Status: DC | PRN

## 2013-05-18 MED ORDER — VENLAFAXINE HCL ER 37.5 MG PO CP24: 37.5000 mg | ORAL_CAPSULE | Freq: Every day | ORAL | Status: DC

## 2013-05-18 NOTE — Telephone Encounter (Signed)
Sent to pharmacy 

## 2013-07-07 ENCOUNTER — Other Ambulatory Visit: Payer: Self-pay

## 2013-07-07 DIAGNOSIS — Z1231 Encounter for screening mammogram for malignant neoplasm of breast: Secondary | ICD-10-CM

## 2013-08-05 ENCOUNTER — Ambulatory Visit
Admission: RE | Admit: 2013-08-05 | Discharge: 2013-08-05 | Disposition: A | Discharge status: Home / Self Care | Payer: BC Managed Care – PPO | Source: Ambulatory Visit

## 2013-08-05 DIAGNOSIS — Z1231 Encounter for screening mammogram for malignant neoplasm of breast: Secondary | ICD-10-CM

## 2013-08-19 ENCOUNTER — Ambulatory Visit (INDEPENDENT_AMBULATORY_CARE_PROVIDER_SITE_OTHER): Payer: BC Managed Care – PPO | Admitting: Physician Assistant

## 2013-08-19 VITALS — BP 122/82 | HR 81 | Temp 98.5°F | Resp 18 | Ht 66.5 in | Wt 164.0 lb

## 2013-08-19 DIAGNOSIS — B354 Tinea corporis: Secondary | ICD-10-CM

## 2013-08-19 DIAGNOSIS — J309 Allergic rhinitis, unspecified: Secondary | ICD-10-CM

## 2013-08-19 MED ORDER — KETOCONAZOLE 2 % EX CREA: TOPICAL_CREAM | Freq: Three times a day (TID) | CUTANEOUS | Status: DC

## 2013-08-19 NOTE — Patient Instructions (Addendum)
Continue to use Lotrimin anti-fungal cream on your chin Get OTC Allegra without the decongestant to help decrease itching Continue to take your regular allergy medication and consider talking to your PCP about taking Singulair

## 2013-08-19 NOTE — Progress Notes (Signed)
I have examined this patient along with the student and agree.  

## 2013-08-19 NOTE — Progress Notes (Signed)
  Subjective:    Patient ID: Julie Nixon, female    DOB: 1961-08-18, 52 y.o.   MRN: [id number]  HPI 52 y.o. Female presents to clinic today for rash on her chin. She is unsure of when it exactly started. She states that it began as just a small dry area. It has gotten larger and progressively itchy and dry. She has tried putting  Kenalog cream without any relief. She noticed yesterday morning that it was circular and she started to freak out a little because she realized it was probably ring worm on her face so she went to store and got OCT Lotrimin. She has used the Lotrimin yesterday and today, which she states has just made it more itchy.  She states that she always has rashes because she has very sensitive skin and she figured that it was just related to her allergies. 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   Review of Systems  All other systems reviewed and are negative.       Objective:   Physical Exam  Nursing note and vitals reviewed. Constitutional: She is oriented to person, place, and time. Vital signs are normal. She appears well-developed and well-nourished. No distress.  HENT:  Head: Normocephalic and atraumatic.  Right Ear: External ear normal.  Left Ear: External ear normal.  Nose: Nose normal.  Eyes: Conjunctivae and lids are normal.  Neck: Trachea normal and normal range of motion. Neck supple. No thyromegaly present.  Cardiovascular: Normal rate, regular rhythm and normal heart sounds.   Pulmonary/Chest: Effort normal and breath sounds normal.  Musculoskeletal: Normal range of motion.  Lymphadenopathy:    She has no cervical adenopathy.  Neurological: She is alert and oriented to person, place, and time.  Skin: Skin is warm, dry and intact. Rash noted. Rash is macular. She is not diaphoretic. No pallor.  Circumferential rash on chin with central clearing, erythema, and scaling   Psychiatric: She has a normal mood and affect. Her behavior is normal. Judgment and thought content normal.       Assessment & Plan:  Tinea corporis - Plan: ketoconazole (NIZORAL) 2 % cream  Instructed patient to use Nizoral cream on tinea Corporis. Instructed her that she should continue to take her daily allergy medications. Discussed with her taking Allegra without the decongestant to help with itching.  She may follow up as needed.

## 2013-09-13 ENCOUNTER — Other Ambulatory Visit: Payer: Self-pay | Admitting: Emergency Medicine

## 2013-09-22 ENCOUNTER — Encounter: Payer: Self-pay | Admitting: Emergency Medicine

## 2013-09-22 ENCOUNTER — Ambulatory Visit (INDEPENDENT_AMBULATORY_CARE_PROVIDER_SITE_OTHER): Payer: BC Managed Care – PPO | Admitting: Emergency Medicine

## 2013-09-22 VITALS — BP 110/66 | HR 79 | Temp 98.3°F | Resp 16 | Ht 66.0 in | Wt 160.8 lb

## 2013-09-22 DIAGNOSIS — J309 Allergic rhinitis, unspecified: Secondary | ICD-10-CM

## 2013-09-22 DIAGNOSIS — Z1211 Encounter for screening for malignant neoplasm of colon: Secondary | ICD-10-CM

## 2013-09-22 DIAGNOSIS — Z136 Encounter for screening for cardiovascular disorders: Secondary | ICD-10-CM

## 2013-09-22 DIAGNOSIS — J45909 Unspecified asthma, uncomplicated: Secondary | ICD-10-CM

## 2013-09-22 DIAGNOSIS — F17200 Nicotine dependence, unspecified, uncomplicated: Secondary | ICD-10-CM | POA: Insufficient documentation

## 2013-09-22 DIAGNOSIS — Z0000 Encounter for general adult medical examination without abnormal findings: Secondary | ICD-10-CM

## 2013-09-22 DIAGNOSIS — Z780 Asymptomatic menopausal state: Secondary | ICD-10-CM | POA: Insufficient documentation

## 2013-09-22 DIAGNOSIS — E785 Hyperlipidemia, unspecified: Secondary | ICD-10-CM

## 2013-09-22 LAB — COMPREHENSIVE METABOLIC PANEL
AST: 16 U/L (ref 0–37)
Albumin: 3.9 g/dL (ref 3.5–5.2)
BUN: 12 mg/dL (ref 6–23)
Calcium: 9 mg/dL (ref 8.4–10.5)
Chloride: 107 mEq/L (ref 96–112)
Potassium: 4.2 mEq/L (ref 3.5–5.3)
Sodium: 138 mEq/L (ref 135–145)
Total Protein: 6.4 g/dL (ref 6.0–8.3)

## 2013-09-22 LAB — POCT URINALYSIS DIPSTICK
Bilirubin, UA: NEGATIVE
Glucose, UA: NEGATIVE
Nitrite, UA: NEGATIVE
pH, UA: 5.5

## 2013-09-22 LAB — CBC WITH DIFFERENTIAL/PLATELET
Basophils Absolute: 0 10*3/uL (ref 0.0–0.1)
HCT: 39.2 % (ref 36.0–46.0)
Hemoglobin: 13.4 g/dL (ref 12.0–15.0)
Lymphocytes Relative: 37 % (ref 12–46)
Monocytes Absolute: 0.7 10*3/uL (ref 0.1–1.0)
Monocytes Relative: 14 % — ABNORMAL HIGH (ref 3–12)
Neutro Abs: 1.6 10*3/uL — ABNORMAL LOW (ref 1.7–7.7)
Neutrophils Relative %: 31 % — ABNORMAL LOW (ref 43–77)
RDW: 12.5 % (ref 11.5–15.5)
WBC: 5.1 10*3/uL (ref 4.0–10.5)

## 2013-09-22 LAB — LIPID PANEL
HDL: 51 mg/dL (ref >39)
LDL Cholesterol: 63 mg/dL (ref 0–99)

## 2013-09-22 MED ORDER — BECLOMETHASONE DIPROPIONATE 80 MCG/ACT IN AERS: 1.0000 | INHALATION_SPRAY | Freq: Two times a day (BID) | RESPIRATORY_TRACT | Status: DC

## 2013-09-22 MED ORDER — VENLAFAXINE HCL ER 37.5 MG PO CP24: ORAL_CAPSULE | ORAL | Status: DC

## 2013-09-22 MED ORDER — FLUTICASONE PROPIONATE 50 MCG/ACT NA SUSP: 2.0000 | Freq: Every day | NASAL | Status: DC

## 2013-09-22 MED ORDER — ALBUTEROL SULFATE HFA 108 (90 BASE) MCG/ACT IN AERS: 2.0000 | INHALATION_SPRAY | Freq: Four times a day (QID) | RESPIRATORY_TRACT | Status: DC | PRN

## 2013-09-22 MED ORDER — AZELASTINE HCL 0.1 % NA SOLN: 2.0000 | Freq: Two times a day (BID) | NASAL | Status: DC

## 2013-09-22 MED ORDER — FEXOFENADINE-PSEUDOEPHED ER 180-240 MG PO TB24: 1.0000 | ORAL_TABLET | Freq: Every day | ORAL | Status: AC

## 2013-09-22 MED ORDER — ATORVASTATIN CALCIUM 10 MG PO TABS: 10.0000 mg | ORAL_TABLET | Freq: Every day | ORAL | Status: DC

## 2013-09-22 NOTE — Progress Notes (Deleted)
  Subjective:    Patient ID: Julie Nixon, female    DOB: 11-12-1961, 52 y.o.   MRN: [id number]  HPI Patient here for annual exam. Papsmear current 2013. Patient complains of menopausal symptoms. Hot flashes, bad days with no reason. Patient exercises regularly, minium 3 days per week. Family history of heart disease.    Review of Systems     Objective:   Physical Exam        Assessment & Plan:

## 2013-09-22 NOTE — Progress Notes (Signed)
  Subjective:    Patient ID: Julie Nixon, female    DOB: 1961/02/04, 52 y.o.   MRN: [id number]  HPI    Review of Systems  Constitutional: Negative.   HENT: Positive for rhinorrhea and sinus pressure.   Eyes: Negative.   Respiratory: Positive for chest tightness.   Cardiovascular: Negative.   Gastrointestinal: Negative.   Endocrine: Negative.   Genitourinary: Negative.   Musculoskeletal: Negative.   Skin: Negative.   Allergic/Immunologic: Negative.   Neurological: Negative.   Hematological: Negative.   Psychiatric/Behavioral: Negative.    she has allergies this time year and has some chest tightness for which she uses her inhaler. She also has significant nasal congestion postnasal drip. For this she uses Allegra-D and Astelin to She has significant menopausal symptoms but these are improved with use of the Effexor.      Objective:   Physical Exam HEENT exam pupils are equal and reactive to light TMs are clear nose normal except for some postnasal drip. Throat is normal. Neck is supple without adenopathy chest is clear to both auscultation and percussion heart is regular rate without murmurs abdomen soft nontender extremities without cyanosis clubbing or edema.  EKG no acute changes      Assessment & Plan:  Referral made to cardiology because a positive family history of intermittent smoking. She is also scheduled for screening colonoscopy. All meds were refilled. She will continue on her Effexor she uses for menopausal symptoms. She declined a flu shot today.

## 2013-09-23 ENCOUNTER — Other Ambulatory Visit: Payer: Self-pay | Admitting: Emergency Medicine

## 2013-09-23 DIAGNOSIS — E0590 Thyrotoxicosis, unspecified without thyrotoxic crisis or storm: Secondary | ICD-10-CM

## 2013-09-23 LAB — TSH: TSH: <0.008 u[IU]/mL — ABNORMAL LOW (ref 0.350–4.500)

## 2013-09-26 LAB — IFOBT (OCCULT BLOOD): IFOBT: NEGATIVE

## 2013-09-27 ENCOUNTER — Telehealth: Payer: Self-pay

## 2013-09-27 NOTE — Telephone Encounter (Signed)
Patient is returning our phone call regarding her lab results.    Best#: [phone]

## 2013-09-27 NOTE — Telephone Encounter (Signed)
See labs 

## 2013-10-22 ENCOUNTER — Encounter: Payer: Self-pay | Admitting: Internal Medicine

## 2013-10-22 ENCOUNTER — Ambulatory Visit (INDEPENDENT_AMBULATORY_CARE_PROVIDER_SITE_OTHER): Payer: BC Managed Care – PPO | Admitting: Internal Medicine

## 2013-10-22 VITALS — BP 144/92 | HR 66 | Ht 66.0 in | Wt 165.6 lb

## 2013-10-22 DIAGNOSIS — Z139 Encounter for screening, unspecified: Secondary | ICD-10-CM

## 2013-10-22 DIAGNOSIS — Z8249 Family history of ischemic heart disease and other diseases of the circulatory system: Secondary | ICD-10-CM

## 2013-10-22 DIAGNOSIS — E785 Hyperlipidemia, unspecified: Secondary | ICD-10-CM

## 2013-10-22 DIAGNOSIS — F17200 Nicotine dependence, unspecified, uncomplicated: Secondary | ICD-10-CM

## 2013-10-22 NOTE — Progress Notes (Signed)
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 She is originally from [place] and is from a large family. Both of her appearance had heart disease in her father had his first heart attack in his 67s. Both her mother and father had bypass surgeries as well and are deceased. She has had a brother who died of a heart attack at age 32 and 61 other brothers who are living but have high blood pressure and abnormal cholesterol. In addition she has 3 sisters who have high blood pressure and abnormal cholesterol. Patient is strong family history, she is interested in assessing her cardiac risk. Just as a history of smoking and quit in 2011 for about one year but has intermittently continued to smoke since then. Other than that she takes Effexor for perimenopausal symptoms and Lipitor which was recently reduced to 5 mg daily due to myalgias. She recently had a physical with Dr. Andee Poles and was reportedly doing pretty well. She is physically active and exercises her regularly for weight control. The only other new finding was that she had a low TSH and appears that she may be hypothyroid. She is scheduled to see Dr. Everardo All with our endocrinology next week. She denies any cardiac symptoms such as chest pain, palpitations, dyspnea on exertion, orthopnea, PND, presyncope, syncope, or other associated symptoms.  PMHx:  Past Medical History  Diagnosis Date  . Anxiety   . Allergy   . Asthma   . Hyperlipidemia     Past Surgical History  Procedure Laterality Date  . Mouth surgery      FAMHx:  Family History  Problem Relation Age of Onset  . Hypertension Mother   . Heart Problems Mother     CABG  . Hypertension Father   . Heart Problems Father     CABG  . Hypertension Sister   . Heart Problems Sister   . Hypertension  Sister   . Heart Problems Sister   . Hypertension Sister   . Heart Problems Sister   . Heart disease Brother     SOCHx:   reports that she has been smoking Cigarettes.  She has a 31 pack-year smoking history. She has never used smokeless tobacco. She reports that she does not drink alcohol or use illicit drugs.  ALLERGIES:  No Known Allergies  ROS: A comprehensive review of systems was negative except for: Behavioral/Psych: positive for mood swings and perimenapausal symptoms  HOME MEDS: Current Outpatient Prescriptions  Medication Sig Dispense Refill  . albuterol (PROVENTIL HFA;VENTOLIN HFA) 108 (90 BASE) MCG/ACT inhaler Inhale 2 puffs into the lungs every 6 (six) hours as needed for wheezing.  18 g  2  . atorvastatin (LIPITOR) 10 MG tablet Take 1 tablet (10 mg total) by mouth daily.  90 tablet  3  . azelastine (ASTELIN) 137 MCG/SPRAY nasal spray Place 2 sprays into the nose 2 (two) times daily. Use in each nostril as directed  30 mL  11  . beclomethasone (QVAR) 80 MCG/ACT inhaler Inhale 1 puff into the lungs 2 (two) times daily as needed.      . fexofenadine-pseudoephedrine (ALLEGRA-D 24) 180-240 MG per 24 hr tablet Take 1 tablet by mouth daily.  30 tablet  11  . fluticasone (FLONASE) 50 MCG/ACT nasal spray Place 2 sprays into the nose  daily as needed.      Marland Kitchen ketoconazole (NIZORAL) 2 % cream Apply topically 3 (three) times daily as needed.      . triamcinolone cream (KENALOG) 0.5 % Apply topically 2 (two) times daily as needed. To rash on back      . venlafaxine XR (EFFEXOR-XR) 37.5 MG 24 hr capsule TAKE ONE CAPSULE BY MOUTH DAILY  30 capsule  11   No current facility-administered medications for this visit.    LABS/IMAGING: No results found for this or any previous visit (from the past 48 hour(s)). No results found.  VITALS: BP 144/92  Pulse 66  Ht 5\' 6"  (1.676 m)  Wt 165 lb 9.6 oz (75.116 kg)  BMI 26.74 kg/m2  LMP 05/03/2012  EXAM: General appearance: alert and no  distress Neck: no carotid bruit and no JVD Lungs: clear to auscultation bilaterally Heart: regular rate and rhythm, S1, S2 normal, no murmur, click, rub or gallop Abdomen: soft, non-tender; bowel sounds normal; no masses,  no organomegaly Extremities: extremities normal, atraumatic, no cyanosis or edema Pulses: 2+ and symmetric Skin: Skin color, texture, turgor normal. No rashes or lesions Neurologic: Grossly normal Psych: Pleasant, joking, affable  EKG: Normal sinus rhythm at 66, no ischemic changes  ASSESSMENT: 1. Strong family history of premature coronary disease 2. Dyslipidemia-on Lipitor, but intolerant to 10 mg or higher doses 3. Tobacco dependence  PLAN: 1.   Mrs. Despain is a very significant family history of cardiac disease and I believe is at high risk for developing coronary disease. In addition to dyslipidemia which she is recently treating, she has been a smoker and has recently restarted smoking. We talked for 4 minutes today about smoking cessation, and ways that she can work at quitting again when she is ready. She says she wants to set a quit date and has had success with nicotine patches in the past but not necessarily with Chantix.  I feel that she will need advanced lipid testing is there possibly is a strong genetic risk and aggressive lipid therapy may be beneficial for her. If she's not at goal strong family history with Lipitor, we may need to switch her to a different statin due to her intolerance of higher doses. Finally to better ascertain her risk status she is asymptomatic, I would recommend coronary calcium scoring. Given her a age of 47 and smoking history, it would be unlikely for her to have any premature coronary disease, however if she does have coronary calcium, this would argue for more aggressive risk factor modification, again especially in light of her family history.  I will bring her back to discuss results of her scan and her lipid profile. I will  make adjustments to her medications as necessary.  Thanks again for referring her.  Chrystie Nose, MD, [facility] Attending Cardiologist [facility]  HILTY,Kenneth C 10/22/2013, 2:29 PM

## 2013-10-22 NOTE — Patient Instructions (Addendum)
Dr. Rennis Golden has ordered a CT calcium score - will be done at [facility]. For [facility] providers to read  Your physician recommends that you return for lab work in the next few days. You will need to be fasting for this blood work.  Your physician recommends that you schedule a follow-up appointment after your test.

## 2013-10-26 ENCOUNTER — Encounter: Payer: Self-pay | Admitting: Endocrinology

## 2013-10-26 ENCOUNTER — Ambulatory Visit (INDEPENDENT_AMBULATORY_CARE_PROVIDER_SITE_OTHER): Payer: BC Managed Care – PPO | Admitting: Endocrinology

## 2013-10-26 VITALS — BP 110/70 | Temp 98.4°F | Ht 66.0 in | Wt 166.9 lb

## 2013-10-26 DIAGNOSIS — E0590 Thyrotoxicosis, unspecified without thyrotoxic crisis or storm: Secondary | ICD-10-CM

## 2013-10-26 NOTE — Progress Notes (Signed)
Subjective:    Patient ID: Julie Nixon, female    DOB: 10-17-1961, 52 y.o.   MRN: [id number]  HPI Pt was noted on routine labs a few mos ago to have abnormal TFT.  She says in retrospect, she has moderate weight gain, worst at the legs, and assoc anxiety. Past Medical History  Diagnosis Date  . Anxiety   . Allergy   . Asthma   . Hyperlipidemia     Past Surgical History  Procedure Laterality Date  . Mouth surgery      History   Social History  . Marital Status: Single    Spouse Name: N/A    Number of Children: N/A  . 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 Smoking status: Current Every Day Smoker -- 1.00 packs/day for 31 years    Types: Cigarettes    Last Attempt to Quit: 08/06/2012  . Smokeless tobacco: Never Used     Comment: trying to quit - on & off for 3 years - down to 1/2 ppd  . Alcohol Use: No  . Drug Use: No  . Sexual Activity: No   Other Topics Concern  . Not on file   Social History Narrative   Divorced. Education: [employer]. Exercise: Elliptical/swim 3 times a week for 30 minutes.    Current Outpatient Prescriptions on File Prior to Visit  Medication Sig Dispense Refill  . albuterol (PROVENTIL HFA;VENTOLIN HFA) 108 (90 BASE) MCG/ACT inhaler Inhale 2 puffs into the lungs every 6 (six) hours as needed for wheezing.  18 g  2  . atorvastatin (LIPITOR) 10 MG tablet Take 1 tablet (10 mg total) by mouth daily.  90 tablet  3  . azelastine (ASTELIN) 137 MCG/SPRAY nasal spray Place 2 sprays into the nose 2 (two) times daily. Use in each nostril as directed  30 mL  11  . beclomethasone (QVAR) 80 MCG/ACT inhaler Inhale 1 puff into the lungs 2 (two) times daily as needed.      . fexofenadine-pseudoephedrine (ALLEGRA-D 24) 180-240 MG per 24 hr tablet Take 1 tablet by mouth daily.  30 tablet  11  . venlafaxine XR (EFFEXOR-XR) 37.5 MG 24 hr capsule TAKE ONE CAPSULE BY MOUTH DAILY   30 capsule  11  . fluticasone (FLONASE) 50 MCG/ACT nasal spray Place 2 sprays into the nose daily as needed.      Marland Kitchen ketoconazole (NIZORAL) 2 % cream Apply topically 3 (three) times daily as needed.      . triamcinolone cream (KENALOG) 0.5 % Apply topically 2 (two) times daily as needed. To rash on back       No current facility-administered medications on file prior to visit.    Allergies  Allergen Reactions  . Egg Yolk    Family History  Problem Relation Age of Onset  . Hypertension Mother   . Heart Problems Mother     CABG  . Hypertension Father   . Heart Problems Father     CABG  . Hypertension Sister   . Heart Problems Sister   . Hypertension Sister   . Heart Problems Sister   . Hypertension Sister   . Heart Problems Sister   . Heart disease Brother   no thyroid problems. BP 110/70  Temp(Src) 98.4 F (36.9 C) (Oral)  Ht 5\' 6"  (1.676 m)  Wt 166 lb 14.4 oz (75.705 kg)  BMI 26.95 kg/m2  LMP 05/03/2012  Review of Systems denies fever, headache, hoarseness, double vision, palpitations, sob, diarrhea, polyuria, myalgias, excessive diaphoresis, numbness, tremor, easy bruising, and rhinorrhea.  She has flushing, and difficulty with concentration.  Her last menstrual period was 18 months ago.      Objective:   Physical Exam VS: see vs page GEN: no distress HEAD: head: no deformity eyes: no periorbital swelling, no proptosis external nose and ears are normal mouth: no lesion seen NECK: supple, thyroid is not enlarged.   CHEST WALL: no deformity LUNGS:  Clear to auscultation.   CV: reg rate and rhythm, no murmur ABD: abdomen is soft, nontender.  no hepatosplenomegaly.  not distended.  no hernia.   MUSCULOSKELETAL: muscle bulk and strength are grossly normal.  no obvious joint swelling.  gait is normal and steady EXTEMITIES: no deformity.  no ulcer on the feet.  feet are of normal color and temp.  no edema PULSES: dorsalis pedis intact bilat.  no carotid bruit NEURO:   cn 2-12 grossly intact.   readily moves all 4's.  sensation is intact to touch on the feet.  No tremor. SKIN:  Normal texture and temperature.  No rash or suspicious lesion is visible.  Not diaphoretic.   NODES:  None palpable at the neck.   PSYCH: alert, oriented x3.  Does not appear anxious nor depressed.  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 Amemorrhea: due to hyperthyroidism, or menopause.

## 2013-10-26 NOTE — Patient Instructions (Signed)

## 2013-11-04 ENCOUNTER — Encounter (HOSPITAL_COMMUNITY)
Admission: RE | Admit: 2013-11-04 | Discharge: 2013-11-04 | Disposition: A | Discharge status: Home / Self Care | Payer: BC Managed Care – PPO | Source: Ambulatory Visit | Attending: Endocrinology | Admitting: Endocrinology

## 2013-11-04 DIAGNOSIS — E0590 Thyrotoxicosis, unspecified without thyrotoxic crisis or storm: Secondary | ICD-10-CM | POA: Insufficient documentation

## 2013-11-05 ENCOUNTER — Encounter (HOSPITAL_COMMUNITY)
Admission: RE | Admit: 2013-11-05 | Discharge: 2013-11-05 | Disposition: A | Discharge status: Home / Self Care | Payer: BC Managed Care – PPO | Source: Ambulatory Visit | Attending: Endocrinology | Admitting: Endocrinology

## 2013-11-05 ENCOUNTER — Telehealth: Payer: Self-pay | Admitting: *Deleted

## 2013-11-05 DIAGNOSIS — E0590 Thyrotoxicosis, unspecified without thyrotoxic crisis or storm: Secondary | ICD-10-CM | POA: Insufficient documentation

## 2013-11-05 DIAGNOSIS — R946 Abnormal results of thyroid function studies: Secondary | ICD-10-CM | POA: Insufficient documentation

## 2013-11-05 MED ORDER — SODIUM PERTECHNETATE TC 99M INJECTION
10.0000 | Freq: Once | INTRAVENOUS | Status: AC | PRN
  Administered 2013-11-05: 10 via INTRAVENOUS

## 2013-11-05 MED ORDER — SODIUM IODIDE I 131 CAPSULE
12.8000 | Freq: Once | INTRAVENOUS | Status: AC | PRN
  Administered 2013-11-05: 12.8 via ORAL

## 2013-11-05 NOTE — Telephone Encounter (Signed)
Called patient to inform that calcium scoring would cost $212 out of pocket plus a fee for the physician who reads the study. Patient is interested in this test, but reports may would like to wait until the new year, as other medical bills have been accruing. Patient reports she will contact office when interested.

## 2013-11-11 ENCOUNTER — Telehealth: Payer: Self-pay | Admitting: Endocrinology

## 2013-11-11 ENCOUNTER — Ambulatory Visit (INDEPENDENT_AMBULATORY_CARE_PROVIDER_SITE_OTHER): Payer: BC Managed Care – PPO | Admitting: Physician Assistant

## 2013-11-11 VITALS — BP 172/90 | HR 76 | Temp 98.0°F | Resp 18 | Ht 65.75 in | Wt 162.6 lb

## 2013-11-11 DIAGNOSIS — M549 Dorsalgia, unspecified: Secondary | ICD-10-CM

## 2013-11-11 MED ORDER — CYCLOBENZAPRINE HCL 10 MG PO TABS: 10.0000 mg | ORAL_TABLET | Freq: Three times a day (TID) | ORAL | Status: DC | PRN

## 2013-11-11 MED ORDER — MELOXICAM 15 MG PO TABS: 15.0000 mg | ORAL_TABLET | Freq: Every day | ORAL | Status: DC

## 2013-11-11 NOTE — Progress Notes (Signed)
  Subjective:    Patient ID: Julie Nixon, female    DOB: December 14, 1961, 52 y.o.   MRN: [id number]  Back Pain Pertinent negatives include no chest pain.   Julie Nixon is a 52 YO female with a history of hyperthyroidism presents today with a 7 day history of left upper back pain.  She states she woke up with 2/10 left upper back pain 7 days ago that she attributes to exercising the previous day but does not recall any specific exercise or movement that caused the injury but does state she thinks she may have "pulled something".  This pain persisted and she has taken 600 mg Ibuprofen BID 6 days ago with moderate relief.  Last night, the patient states she woke up and sneezed while standing that caused excruciating 10/10 pain in her left upper back that caused her to fall to the floor.  She denies any other trauma from falling.  She currently has 5/10 pain severity when not moving but states certain movements exacerbate her pain to 10/10 severity.  She has taken 600 mg Ibuprofen BID today with very mild relief as well as icy hot cream with mild relief.  She states she went to get a massage today but the masseuse denied her a massage due to bruising on her left upper back.    The patient also has a history of hyperthyroidism with a TSH of <.008 one month ago. A free T4 was ordered but never drawn.  The patient is not interested in having blood work today but would like this test be done soon.      Past Medical History  Diagnosis Date  . Anxiety   . Allergy   . Asthma   . Hyperlipidemia       Review of Systems  Respiratory: Negative for chest tightness and shortness of breath.   Cardiovascular: Negative for chest pain.  Musculoskeletal: Positive for back pain and myalgias.  Psychiatric/Behavioral: Negative for sleep disturbance. The patient is nervous/anxious.        Objective:   Physical Exam Julie Nixon is a well-developed, well-nourished Caucasian female in tears at times and  frequently changing positions to relieve pain Heart: normal rate and rhythm, no murmurs, rubs, or gallops Lungs: clear to auscultation bilaterally, no wheezing, rhonchi, or rales Skin: Mildly erythematous maculopapular area near left shoulder blade where icy-hot cream was applied M/S: Non-tender to palpation of left scapular region and left under-arm region.  Left upper back pain located specifically just posterior to posterior border of scapula exacerbated with flexion/abduction of shoulder and rotation and lateral bend at waist.   Neuro: Equal grip strength bilaterally, DTRs intact bilaterally   BP 172/90  Pulse 76  Temp(Src) 98 F (36.7 C) (Oral)  Resp 18  Ht 5' 5.75" (1.67 m)  Wt 162 lb 9.6 oz (73.755 kg)  BMI 26.45 kg/m2  SpO2 100%  LMP 10/27/2013      Assessment & Plan:  Back pain - Plan: meloxicam (MOBIC) 15 MG tablet, cyclobenzaprine (FLEXERIL) 10 MG tablet.   RTC if pain worsens or does not improve in 10-14 days.   Patient Instructions  Get plenty of rest and drink at least 64 ounces of water daily. Apply a warm compress to the area of pain. Stop taking Ibuprofen

## 2013-11-11 NOTE — Patient Instructions (Signed)
Get plenty of rest and drink at least 64 ounces of water daily. Apply a warm compress to the area of pain.

## 2013-11-11 NOTE — Progress Notes (Signed)
I have examined this patient along with the student and agree.  

## 2013-11-16 NOTE — Telephone Encounter (Signed)
Called pt and advised her that esults are as expected. Dr Everardo All has requested the radioactive iodine pill for you. You will receive a phone call, about a day and time for an appointment. Please come back for a follow-up appointment in 6-8 weeks later. Pt understood.

## 2013-11-16 NOTE — Telephone Encounter (Signed)
The patient called and is hoping to speak with the nurse about her thyroid test. Thanks!

## 2013-11-16 NOTE — Telephone Encounter (Signed)
Results are as we expected. i have requested the radioactive iodine pill for you. you will receive a phone call, about a day and time for an appointment. Please come back for a follow-up appointment in 6-8 weeks later

## 2013-11-17 ENCOUNTER — Other Ambulatory Visit: Payer: Self-pay | Admitting: Physician Assistant

## 2013-11-24 ENCOUNTER — Telehealth: Payer: Self-pay | Admitting: Endocrinology

## 2013-11-24 DIAGNOSIS — E0590 Thyrotoxicosis, unspecified without thyrotoxic crisis or storm: Secondary | ICD-10-CM

## 2013-11-24 NOTE — Telephone Encounter (Signed)
Pt would like to speak with Dr. Everardo All nurse Third time calling with no response  Please call back ASAP Call back [phone]  Thank You :)

## 2013-11-24 NOTE — Telephone Encounter (Signed)
you will receive a phone call, about a day and time for an appointment, for the radioactive iodine.

## 2013-11-24 NOTE — Telephone Encounter (Signed)
Pt states that no one has ever called her from radiology to schedule your ordered scan.  She called them & they were very rude to her (in her words) and unhelpful.  She states that you had wanted her to have this imaging performed as soon as possible and is concerned because no one has scheduled it yet.  Please advise, Callback number is [phone]

## 2013-12-11 ENCOUNTER — Ambulatory Visit (HOSPITAL_COMMUNITY): Payer: BC Managed Care – PPO

## 2013-12-20 ENCOUNTER — Ambulatory Visit (INDEPENDENT_AMBULATORY_CARE_PROVIDER_SITE_OTHER): Payer: BC Managed Care – PPO | Admitting: Emergency Medicine

## 2013-12-20 VITALS — BP 120/72 | HR 85 | Temp 98.4°F | Resp 18 | Ht 67.0 in | Wt 156.0 lb

## 2013-12-20 DIAGNOSIS — R05 Cough: Secondary | ICD-10-CM

## 2013-12-20 DIAGNOSIS — J101 Influenza due to other identified influenza virus with other respiratory manifestations: Secondary | ICD-10-CM

## 2013-12-20 DIAGNOSIS — R509 Fever, unspecified: Secondary | ICD-10-CM

## 2013-12-20 DIAGNOSIS — J111 Influenza due to unidentified influenza virus with other respiratory manifestations: Secondary | ICD-10-CM

## 2013-12-20 LAB — POCT INFLUENZA A/B
Influenza A, POC: POSITIVE
Influenza B, POC: NEGATIVE

## 2013-12-20 MED ORDER — BENZONATATE 100 MG PO CAPS: 100.0000 mg | ORAL_CAPSULE | Freq: Three times a day (TID) | ORAL | Status: DC | PRN

## 2013-12-20 MED ORDER — OSELTAMIVIR PHOSPHATE 75 MG PO CAPS: 75.0000 mg | ORAL_CAPSULE | Freq: Two times a day (BID) | ORAL | Status: DC

## 2013-12-20 NOTE — Progress Notes (Addendum)
   Subjective:    Patient ID: Julie Nixon, female    DOB: [birth year], 52 y.o.   MRN: [id number] This chart was scribed for Julie Chris, MD by Nicholos Johns, Medical Scribe. This patient's care was started at 9:16 AM.  HPI HPI Comments: [name] is a 52 y.o. female who presents to the [facility] complaining of gradually worsening cough and nasal congestion with associated sore throat and fever. Pt reports rhinorrhea producing a clear mucous, and a greenish mucous from her mouth upon coughing, present yesterday. She denies any productive cough today. Pt is scheduled to have a cholenoscopy in 2 days. Pt states she is usually prescribed a steroid shot in the past when the same symptoms are present. Pt denies having had a flu shot this year.   Review of Systems  Constitutional: Positive for fever.  HENT: Positive for congestion, rhinorrhea and sore throat.   Respiratory: Positive for cough.        Objective:   Physical Exam  CONSTITUTIONAL: Well developed/well nourished HEAD AND FACE: Normocephalic/atraumatic EYES: EOMI/PERRL ENMT: Mucous membranes moist significant nasal congestion and redness of the posterior pharynx NECK: supple no meningeal signs SPINE:entire spine nontender CV: S1/S2 noted, no murmurs/rubs/gallops noted LUNGS: Lungs are clear to auscultation bilaterally, no apparent distress ABDOMEN: soft, nontender, no rebound or guarding GU:no cva tenderness NEURO: Pt is awake/alert, moves all extremitiesx4 EXTREMITIES: pulses normal, full ROM SKIN: warm, color normal PSYCH: no abnormalities of mood noted  Filed Vitals:   12/20/13 0826  BP: 120/72  Pulse: 85  Temp: 98.4 F (36.9 C)  TempSrc: Oral  Resp: 18  Height: 5\' 7"  (1.702 m)  Weight: 156 lb (70.761 kg)  SpO2: 97%   Results for orders placed in visit on 12/20/13  POCT INFLUENZA A/B      Result Value Range   Influenza A, POC Positive     Influenza B, POC Negative    POCT  RAPID STREP A (OFFICE)      Result Value Range   Rapid Strep A Screen Negative  Negative        Assessment & Plan:  We'll treat with Tamiflu Tessalon Perles

## 2013-12-20 NOTE — Progress Notes (Deleted)
   Subjective:    Patient ID: Julie Nixon, female    DOB: 1961-11-30, 52 y.o.   MRN: [id number]  HPI    Review of Systems     Objective:   Physical Exam        Assessment & Plan:

## 2013-12-20 NOTE — Patient Instructions (Signed)

## 2014-01-04 ENCOUNTER — Encounter (HOSPITAL_COMMUNITY)
Admission: RE | Admit: 2014-01-04 | Discharge: 2014-01-04 | Disposition: A | Discharge status: Home / Self Care | Payer: BC Managed Care – PPO | Source: Ambulatory Visit | Attending: Endocrinology | Admitting: Endocrinology

## 2014-01-04 DIAGNOSIS — E0590 Thyrotoxicosis, unspecified without thyrotoxic crisis or storm: Secondary | ICD-10-CM | POA: Insufficient documentation

## 2014-01-04 LAB — HCG, SERUM, QUALITATIVE: PREG SERUM: NEGATIVE

## 2014-01-04 MED ORDER — SODIUM IODIDE I 131 CAPSULE
15.1000 | Freq: Once | INTRAVENOUS | Status: AC | PRN
  Administered 2014-01-04: 15.1 via ORAL

## 2014-03-01 ENCOUNTER — Encounter: Payer: Self-pay | Admitting: Endocrinology

## 2014-03-01 ENCOUNTER — Ambulatory Visit (INDEPENDENT_AMBULATORY_CARE_PROVIDER_SITE_OTHER): Payer: BC Managed Care – PPO | Admitting: Endocrinology

## 2014-03-01 VITALS — BP 118/80 | HR 85 | Temp 98.1°F | Ht 67.0 in | Wt 161.0 lb

## 2014-03-01 DIAGNOSIS — E0590 Thyrotoxicosis, unspecified without thyrotoxic crisis or storm: Secondary | ICD-10-CM

## 2014-03-01 LAB — TSH

## 2014-03-01 LAB — T4, FREE: FREE T4: 2.18 ng/dL — AB (ref 0.60–1.60)

## 2014-03-01 NOTE — Progress Notes (Signed)
Subjective:    Patient ID: Julie Nixon, female    DOB: 1961/08/11, 53 y.o.   MRN: [id number]  HPI In late 2014, pt was dx'ed with hyperthyroidism, due to grave's dz.  She took I-131 rx in January of 2015.  Since I-131 rx, she still has weight gain and heat intolerance.   Past Medical History  Diagnosis Date  . Anxiety   . Allergy   . Asthma   . Hyperlipidemia   . Thyroid disease     Past Surgical History  Procedure Laterality Date  . Mouth surgery      History   Social History  . Marital Status: Single    Spouse Name: N/A    Number of Children: N/A  . 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 Smoking status: Current Every Day Smoker -- 1.00 packs/day for 31 years    Types: Cigarettes    Last Attempt to Quit: 08/06/2012  . Smokeless tobacco: Never Used     Comment: trying to quit - on & off for 3 years - down to 1/2 ppd  . Alcohol Use: No  . Drug Use: No  . Sexual Activity: No   Other Topics Concern  . Not on file   Social History Narrative   Divorced. Education: [employer]. Exercise: Elliptical/swim 3 times a week for 30 minutes.   Ex-husband addicted to narcotics and is anxious about becoming addicted to any type of medication    Current Outpatient Prescriptions on File Prior to Visit  Medication Sig Dispense Refill  . albuterol (PROVENTIL HFA;VENTOLIN HFA) 108 (90 BASE) MCG/ACT inhaler Inhale 2 puffs into the lungs every 6 (six) hours as needed for wheezing.  18 g  2  . atorvastatin (LIPITOR) 10 MG tablet Take 1 tablet (10 mg total) by mouth daily.  90 tablet  3  . azelastine (ASTELIN) 137 MCG/SPRAY nasal spray Place 2 sprays into the nose 2 (two) times daily. Use in each nostril as directed  30 mL  11  . beclomethasone (QVAR) 80 MCG/ACT inhaler Inhale 1 puff into the lungs 2 (two) times daily as needed.      . benzonatate (TESSALON) 100 MG capsule Take 1-2 capsules  (100-200 mg total) by mouth 3 (three) times daily as needed for cough.  40 capsule  0  . cyclobenzaprine (FLEXERIL) 10 MG tablet Take 1 tablet (10 mg total) by mouth 3 (three) times daily as needed for muscle spasms.  30 tablet  0  . fexofenadine-pseudoephedrine (ALLEGRA-D 24) 180-240 MG per 24 hr tablet Take 1 tablet by mouth daily.  30 tablet  11  . fluticasone (FLONASE) 50 MCG/ACT nasal spray Place 2 sprays into the nose daily as needed.      Marland Kitchen. ketoconazole (NIZORAL) 2 % cream Apply topically 3 (three) times daily as needed.      . meloxicam (MOBIC) 15 MG tablet Take 1 tablet (15 mg total) by mouth daily.  30 tablet  1  . oseltamivir (TAMIFLU) 75 MG capsule Take 1 capsule (75 mg total) by mouth 2 (two) times daily.  10 capsule  0  . triamcinolone cream (KENALOG) 0.5 % Apply topically 2 (two) times daily as needed. To rash on back      . venlafaxine XR (EFFEXOR-XR) 37.5 MG 24 hr capsule TAKE ONE CAPSULE BY MOUTH DAILY  30 capsule  11   No current  facility-administered medications on file prior to visit.    Allergies  Allergen Reactions  . Egg Yolk     Family History  Problem Relation Age of Onset  . Hypertension Mother   . Heart Problems Mother     CABG  . Hypertension Father   . Heart Problems Father     CABG  . Hypertension Sister   . Heart Problems Sister   . Hypertension Sister   . Heart Problems Sister   . Hypertension Sister   . Heart Problems Sister   . Heart disease Brother     BP 118/80  Pulse 85  Temp(Src) 98.1 F (36.7 C) (Oral)  Ht 5\' 7"  (1.702 m)  Wt 161 lb (73.029 kg)  BMI 25.21 kg/m2  SpO2 97%   Review of Systems Anxiety is much better.    Objective:   Physical Exam VITAL SIGNS:  See vs page GENERAL: no distress NECK: There is no palpable thyroid enlargement.  No thyroid nodule is palpable.  No palpable lymphadenopathy at the anterior neck.    Lab Results  Component Value Date   TSH 0.00 Repeated and verified X2.* 03/01/2014      Assessment &  Plan:  Hyperthyroidism: not better yet.

## 2014-03-01 NOTE — Patient Instructions (Signed)
blood tests are being requested for you today.  We'll contact you with results. Please come back for a follow-up appointment in 1 month.    

## 2014-03-30 ENCOUNTER — Encounter: Payer: Self-pay | Admitting: Emergency Medicine

## 2014-03-31 ENCOUNTER — Encounter: Payer: Self-pay | Admitting: Endocrinology

## 2014-03-31 ENCOUNTER — Ambulatory Visit (INDEPENDENT_AMBULATORY_CARE_PROVIDER_SITE_OTHER): Payer: BC Managed Care – PPO | Admitting: Endocrinology

## 2014-03-31 VITALS — BP 116/64 | HR 90 | Temp 98.5°F | Ht 67.0 in | Wt 160.0 lb

## 2014-03-31 DIAGNOSIS — E0590 Thyrotoxicosis, unspecified without thyrotoxic crisis or storm: Secondary | ICD-10-CM

## 2014-03-31 NOTE — Patient Instructions (Signed)
blood tests are being requested for you today.  We'll contact you with results. Please come back for a follow-up appointment in 1 month.    

## 2014-03-31 NOTE — Progress Notes (Signed)
Subjective:    Patient ID: Julie Nixon, female    DOB: 1961-05-11, 53 y.o.   MRN: [id number]  HPI In late 2014, pt was dx'ed with hyperthyroidism, due to grave's dz.  She took I-131 rx in January of 2015.  Since I-131 rx, she still has anxiety and hair loss.  Past Medical History  Diagnosis Date  . Anxiety   . Allergy   . Asthma   . Hyperlipidemia   . Thyroid disease     Past Surgical History  Procedure Laterality Date  . Mouth surgery      History   Social History  . Marital Status: Single    Spouse Name: N/A    Number of Children: N/A  . 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 Smoking status: Current Every Day Smoker -- 1.00 packs/day for 31 years    Types: Cigarettes    Last Attempt to Quit: 08/06/2012  . Smokeless tobacco: Never Used     Comment: trying to quit - on & off for 3 years - down to 1/2 ppd  . Alcohol Use: No  . Drug Use: No  . Sexual Activity: No   Other Topics Concern  . Not on file   Social History Narrative   Divorced. Education: [employer]. Exercise: Elliptical/swim 3 times a week for 30 minutes.   Ex-husband addicted to narcotics and is anxious about becoming addicted to any type of medication    Current Outpatient Prescriptions on File Prior to Visit  Medication Sig Dispense Refill  . albuterol (PROVENTIL HFA;VENTOLIN HFA) 108 (90 BASE) MCG/ACT inhaler Inhale 2 puffs into the lungs every 6 (six) hours as needed for wheezing.  18 g  2  . atorvastatin (LIPITOR) 10 MG tablet Take 1 tablet (10 mg total) by mouth daily.  90 tablet  3  . azelastine (ASTELIN) 137 MCG/SPRAY nasal spray Place 2 sprays into the nose 2 (two) times daily. Use in each nostril as directed  30 mL  11  . beclomethasone (QVAR) 80 MCG/ACT inhaler Inhale 1 puff into the lungs 2 (two) times daily as needed.      . benzonatate (TESSALON) 100 MG capsule Take 1-2 capsules (100-200 mg total)  by mouth 3 (three) times daily as needed for cough.  40 capsule  0  . cyclobenzaprine (FLEXERIL) 10 MG tablet Take 1 tablet (10 mg total) by mouth 3 (three) times daily as needed for muscle spasms.  30 tablet  0  . fexofenadine-pseudoephedrine (ALLEGRA-D 24) 180-240 MG per 24 hr tablet Take 1 tablet by mouth daily.  30 tablet  11  . fluticasone (FLONASE) 50 MCG/ACT nasal spray Place 2 sprays into the nose daily as needed.      Marland Kitchen ketoconazole (NIZORAL) 2 % cream Apply topically 3 (three) times daily as needed.      . meloxicam (MOBIC) 15 MG tablet Take 1 tablet (15 mg total) by mouth daily.  30 tablet  1  . oseltamivir (TAMIFLU) 75 MG capsule Take 1 capsule (75 mg total) by mouth 2 (two) times daily.  10 capsule  0  . triamcinolone cream (KENALOG) 0.5 % Apply topically 2 (two) times daily as needed. To rash on back      . venlafaxine XR (EFFEXOR-XR) 37.5 MG 24 hr capsule TAKE ONE CAPSULE BY MOUTH DAILY  30 capsule  11   No current facility-administered medications  on file prior to visit.    Allergies  Allergen Reactions  . Egg Yolk     Family History  Problem Relation Age of Onset  . Hypertension Mother   . Heart Problems Mother     CABG  . Hypertension Father   . Heart Problems Father     CABG  . Hypertension Sister   . Heart Problems Sister   . Hypertension Sister   . Heart Problems Sister   . Hypertension Sister   . Heart Problems Sister   . Heart disease Brother     BP 116/64  Pulse 90  Temp(Src) 98.5 F (36.9 C) (Oral)  Ht 5\' 7"  (1.702 m)  Wt 160 lb (72.576 kg)  BMI 25.05 kg/m2  SpO2 94%  Review of Systems Denies weight change    Objective:   Physical Exam VITAL SIGNS:  See vs page GENERAL: no distress NECK: There is no palpable thyroid enlargement.  No thyroid nodule is palpable.  No palpable lymphadenopathy at the anterior neck.        Assessment & Plan:  Hyperthyroidism: she will soon improve due to her I-131 rx. Hair loss: uncertain if  thyroid-related.

## 2014-04-01 LAB — TSH: TSH: 0 u[IU]/mL — ABNORMAL LOW (ref 0.35–5.50)

## 2014-04-01 LAB — T4, FREE: Free T4: 1.01 ng/dL (ref 0.60–1.60)

## 2014-04-21 ENCOUNTER — Ambulatory Visit (INDEPENDENT_AMBULATORY_CARE_PROVIDER_SITE_OTHER): Payer: BC Managed Care – PPO | Admitting: Endocrinology

## 2014-04-21 ENCOUNTER — Encounter: Payer: Self-pay | Admitting: Endocrinology

## 2014-04-21 VITALS — BP 126/80 | HR 82 | Temp 99.0°F | Ht 67.0 in | Wt 161.0 lb

## 2014-04-21 DIAGNOSIS — E0590 Thyrotoxicosis, unspecified without thyrotoxic crisis or storm: Secondary | ICD-10-CM

## 2014-04-21 NOTE — Progress Notes (Signed)
Subjective:    Patient ID: Julie Nixon, female    DOB: 04/18/1961, 53 y.o.   MRN: [id number]  HPI In late 2014, pt was dx'ed with hyperthyroidism, due to grave's dz.  She took I-131 rx in January of 2015.  Since I-131 rx, she continues to c/o hair loss.  Past Medical History  Diagnosis Date  . Anxiety   . Allergy   . Asthma   . Hyperlipidemia   . Thyroid disease     Past Surgical History  Procedure Laterality Date  . Mouth surgery      History   Social History  . Marital Status: Single    Spouse Name: N/A    Number of Children: N/A  . 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 Smoking status: Current Every Day Smoker -- 1.00 packs/day for 31 years    Types: Cigarettes    Last Attempt to Quit: 08/06/2012  . Smokeless tobacco: Never Used     Comment: trying to quit - on & off for 3 years - down to 1/2 ppd  . Alcohol Use: No  . Drug Use: No  . Sexual Activity: No   Other Topics Concern  . Not on file   Social History Narrative   Divorced. [employer]: [employer]. Exercise: Elliptical/swim 3 times a week for 30 minutes.   Ex-husband addicted to narcotics and is anxious about becoming addicted to any type of medication    Current Outpatient Prescriptions on File Prior to Visit  Medication Sig Dispense Refill  . albuterol (PROVENTIL HFA;VENTOLIN HFA) 108 (90 BASE) MCG/ACT inhaler Inhale 2 puffs into the lungs every 6 (six) hours as needed for wheezing.  18 g  2  . atorvastatin (LIPITOR) 10 MG tablet Take 1 tablet (10 mg total) by mouth daily.  90 tablet  3  . azelastine (ASTELIN) 137 MCG/SPRAY nasal spray Place 2 sprays into the nose 2 (two) times daily. Use in each nostril as directed  30 mL  11  . beclomethasone (QVAR) 80 MCG/ACT inhaler Inhale 1 puff into the lungs 2 (two) times daily as needed.      . benzonatate (TESSALON) 100 MG capsule Take 1-2 capsules (100-200 mg total) by  mouth 3 (three) times daily as needed for cough.  40 capsule  0  . cyclobenzaprine (FLEXERIL) 10 MG tablet Take 1 tablet (10 mg total) by mouth 3 (three) times daily as needed for muscle spasms.  30 tablet  0  . fexofenadine-pseudoephedrine (ALLEGRA-D 24) 180-240 MG per 24 hr tablet Take 1 tablet by mouth daily.  30 tablet  11  . fluticasone (FLONASE) 50 MCG/ACT nasal spray Place 2 sprays into the nose daily as needed.      Marland Kitchen. ketoconazole (NIZORAL) 2 % cream Apply topically 3 (three) times daily as needed.      . meloxicam (MOBIC) 15 MG tablet Take 1 tablet (15 mg total) by mouth daily.  30 tablet  1  . oseltamivir (TAMIFLU) 75 MG capsule Take 1 capsule (75 mg total) by mouth 2 (two) times daily.  10 capsule  0  . triamcinolone cream (KENALOG) 0.5 % Apply topically 2 (two) times daily as needed. To rash on back      . venlafaxine XR (EFFEXOR-XR) 37.5 MG 24 hr capsule TAKE ONE CAPSULE BY MOUTH DAILY  30 capsule  11   No current facility-administered medications on  file prior to visit.    Allergies  Allergen Reactions  . Egg Yolk     Family History  Problem Relation Age of Onset  . Hypertension Mother   . Heart Problems Mother     CABG  . Hypertension Father   . Heart Problems Father     CABG  . Hypertension Sister   . Heart Problems Sister   . Hypertension Sister   . Heart Problems Sister   . Hypertension Sister   . Heart Problems Sister   . Heart disease Brother     BP 126/80  Pulse 82  Temp(Src) 99 F (37.2 C) (Oral)  Ht 5\' 7"  (1.702 m)  Wt 161 lb (73.029 kg)  BMI 25.21 kg/m2  SpO2 96%  Review of Systems Denies weight change    Objective:   Physical Exam VITAL SIGNS:  See vs page GENERAL: no distress NECK: There is no palpable thyroid enlargement.  No thyroid nodule is palpable.  No palpable lymphadenopathy at the anterior neck.   Lab Results  Component Value Date   TSH 4.35 04/21/2014      Assessment & Plan:  Post-I-131 hypothyroidism, new

## 2014-04-21 NOTE — Patient Instructions (Signed)
blood tests are being requested for you today.  We'll contact you with results. Please come back for a follow-up appointment in 1 month.    

## 2014-04-22 LAB — T4, FREE: Free T4: 0.49 ng/dL — ABNORMAL LOW (ref 0.60–1.60)

## 2014-04-22 LAB — TSH: TSH: 4.35 u[IU]/mL (ref 0.35–5.50)

## 2014-04-22 MED ORDER — LEVOTHYROXINE SODIUM 100 MCG PO TABS: 100.0000 ug | ORAL_TABLET | Freq: Every day | ORAL | Status: DC

## 2014-04-30 ENCOUNTER — Ambulatory Visit: Payer: BC Managed Care – PPO | Admitting: Endocrinology

## 2014-05-14 ENCOUNTER — Encounter: Payer: Self-pay | Admitting: Emergency Medicine

## 2014-05-25 ENCOUNTER — Ambulatory Visit (INDEPENDENT_AMBULATORY_CARE_PROVIDER_SITE_OTHER): Payer: BC Managed Care – PPO | Admitting: Endocrinology

## 2014-05-25 ENCOUNTER — Encounter: Payer: Self-pay | Admitting: Endocrinology

## 2014-05-25 VITALS — BP 114/80 | HR 77 | Temp 98.3°F | Ht 67.0 in | Wt 162.0 lb

## 2014-05-25 DIAGNOSIS — E890 Postprocedural hypothyroidism: Secondary | ICD-10-CM

## 2014-05-25 LAB — TSH: TSH: 2.81 u[IU]/mL (ref 0.35–4.50)

## 2014-05-25 NOTE — Progress Notes (Signed)
Subjective:    Patient ID: Julie Nixon, female    DOB: 11/13/1961, 53 y.o.   MRN: [id number]  HPI In late 2014, pt was dx'ed with hyperthyroidism, due to grave's dz.  She took I-131 rx in January of 2015.  In April of 2015, she was noted to be hypothyroid, and was rx'ed synthroid.  pt states she feels well in general. Past Medical History  Diagnosis Date  . Anxiety   . Allergy   . Asthma   . Hyperlipidemia   . Thyroid disease     Past Surgical History  Procedure Laterality Date  . Mouth surgery      History   Social History  . Marital Status: Single    Spouse Name: N/A    Number of Children: N/A  . 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 Smoking status: Current Every Day Smoker -- 1.00 packs/day for 31 years    Types: Cigarettes    Last Attempt to Quit: 08/06/2012  . Smokeless tobacco: Never Used     Comment: trying to quit - on & off for 3 years - down to 1/2 ppd  . Alcohol Use: No  . Drug Use: No  . Sexual Activity: No   Other Topics Concern  . Not on file   Social History Narrative   Divorced. Education: [employer]. Exercise: Elliptical/swim 3 times a week for 30 minutes.   Ex-husband addicted to narcotics and is anxious about becoming addicted to any type of medication    Current Outpatient Prescriptions on File Prior to Visit  Medication Sig Dispense Refill  . albuterol (PROVENTIL HFA;VENTOLIN HFA) 108 (90 BASE) MCG/ACT inhaler Inhale 2 puffs into the lungs every 6 (six) hours as needed for wheezing.  18 g  2  . atorvastatin (LIPITOR) 10 MG tablet Take 1 tablet (10 mg total) by mouth daily.  90 tablet  3  . azelastine (ASTELIN) 137 MCG/SPRAY nasal spray Place 2 sprays into the nose 2 (two) times daily. Use in each nostril as directed  30 mL  11  . beclomethasone (QVAR) 80 MCG/ACT inhaler Inhale 1 puff into the lungs 2 (two) times daily as needed.      . benzonatate  (TESSALON) 100 MG capsule Take 1-2 capsules (100-200 mg total) by mouth 3 (three) times daily as needed for cough.  40 capsule  0  . cyclobenzaprine (FLEXERIL) 10 MG tablet Take 1 tablet (10 mg total) by mouth 3 (three) times daily as needed for muscle spasms.  30 tablet  0  . fexofenadine-pseudoephedrine (ALLEGRA-D 24) 180-240 MG per 24 hr tablet Take 1 tablet by mouth daily.  30 tablet  11  . fluticasone (FLONASE) 50 MCG/ACT nasal spray Place 2 sprays into the nose daily as needed.      Marland Kitchen. ketoconazole (NIZORAL) 2 % cream Apply topically 3 (three) times daily as needed.      Marland Kitchen. levothyroxine (SYNTHROID, LEVOTHROID) 100 MCG tablet Take 1 tablet (100 mcg total) by mouth daily before breakfast.  30 tablet  2  . meloxicam (MOBIC) 15 MG tablet Take 1 tablet (15 mg total) by mouth daily.  30 tablet  1  . oseltamivir (TAMIFLU) 75 MG capsule Take 1 capsule (75 mg total) by mouth 2 (two) times daily.  10 capsule  0  . triamcinolone cream (KENALOG) 0.5 % Apply topically 2 (two) times daily as needed.  To rash on back      . venlafaxine XR (EFFEXOR-XR) 37.5 MG 24 hr capsule TAKE ONE CAPSULE BY MOUTH DAILY  30 capsule  11   No current facility-administered medications on file prior to visit.    Allergies  Allergen Reactions  . Egg Yolk     Family History  Problem Relation Age of Onset  . Hypertension Mother   . Heart Problems Mother     CABG  . Hypertension Father   . Heart Problems Father     CABG  . Hypertension Sister   . Heart Problems Sister   . Hypertension Sister   . Heart Problems Sister   . Hypertension Sister   . Heart Problems Sister   . 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

## 2014-05-25 NOTE — Patient Instructions (Addendum)
blood tests are being requested for you today.  We'll contact you with results. Please come back for a follow-up appointment in 6 weeks.   

## 2014-06-30 ENCOUNTER — Other Ambulatory Visit: Payer: Self-pay

## 2014-06-30 DIAGNOSIS — Z1231 Encounter for screening mammogram for malignant neoplasm of breast: Secondary | ICD-10-CM

## 2014-07-08 ENCOUNTER — Ambulatory Visit (INDEPENDENT_AMBULATORY_CARE_PROVIDER_SITE_OTHER): Payer: BC Managed Care – PPO | Admitting: Endocrinology

## 2014-07-08 ENCOUNTER — Encounter: Payer: Self-pay | Admitting: Endocrinology

## 2014-07-08 VITALS — BP 122/80 | HR 63 | Temp 98.1°F | Ht 67.0 in | Wt 162.0 lb

## 2014-07-08 DIAGNOSIS — E890 Postprocedural hypothyroidism: Secondary | ICD-10-CM

## 2014-07-08 LAB — TSH: TSH: 3.96 u[IU]/mL (ref 0.35–4.50)

## 2014-07-08 NOTE — Patient Instructions (Addendum)
blood tests are being requested for you today.  We'll contact you with results. Please come back for a follow-up appointment in 3-4 months 

## 2014-07-08 NOTE — Progress Notes (Signed)
Subjective:    Patient ID: Julie Nixon, female    DOB: [birth year], 53 y.o.   MRN: [id number]  HPI In late 2014, pt was dx'ed with hyperthyroidism, due to grave's dz.  She took I-131 rx in January of 2015.  In April of 2015, she was noted to be hypothyroid, and was rx'ed synthroid.  pt states she feels well in general.   Past Medical History  Diagnosis Date  . Anxiety   . Allergy   . Asthma   . Hyperlipidemia   . Thyroid disease     Past Surgical History  Procedure Laterality Date  . Mouth surgery      History   Social History  . Marital Status: Single    Spouse Name: N/A    Number of Children: N/A  . 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 Smoking status: Current Every Day Smoker -- 1.00 packs/day for 31 years    Types: Cigarettes    Last Attempt to Quit: 08/06/2012  . Smokeless tobacco: Never Used     Comment: trying to quit - on & off for 3 years - down to 1/2 ppd  . Alcohol Use: No  . Drug Use: No  . Sexual Activity: No   Other Topics Concern  . Not on file   Social History Narrative   Divorced. Education: [employer]. Exercise: Elliptical/swim 3 times a week for 30 minutes.   Ex-husband addicted to narcotics and is anxious about becoming addicted to any type of medication    Current Outpatient Prescriptions on File Prior to Visit  Medication Sig Dispense Refill  . albuterol (PROVENTIL HFA;VENTOLIN HFA) 108 (90 BASE) MCG/ACT inhaler Inhale 2 puffs into the lungs every 6 (six) hours as needed for wheezing.  18 g  2  . atorvastatin (LIPITOR) 10 MG tablet Take 1 tablet (10 mg total) by mouth daily.  90 tablet  3  . azelastine (ASTELIN) 137 MCG/SPRAY nasal spray Place 2 sprays into the nose 2 (two) times daily. Use in each nostril as directed  30 mL  11  . beclomethasone (QVAR) 80 MCG/ACT inhaler Inhale 1 puff into the lungs 2 (two) times daily as needed.      . benzonatate  (TESSALON) 100 MG capsule Take 1-2 capsules (100-200 mg total) by mouth 3 (three) times daily as needed for cough.  40 capsule  0  . cyclobenzaprine (FLEXERIL) 10 MG tablet Take 1 tablet (10 mg total) by mouth 3 (three) times daily as needed for muscle spasms.  30 tablet  0  . fexofenadine-pseudoephedrine (ALLEGRA-D 24) 180-240 MG per 24 hr tablet Take 1 tablet by mouth daily.  30 tablet  11  . fluticasone (FLONASE) 50 MCG/ACT nasal spray Place 2 sprays into the nose daily as needed.      Marland Kitchen. ketoconazole (NIZORAL) 2 % cream Apply topically 3 (three) times daily as needed.      Marland Kitchen. levothyroxine (SYNTHROID, LEVOTHROID) 100 MCG tablet Take 1 tablet (100 mcg total) by mouth daily before breakfast.  30 tablet  2  . meloxicam (MOBIC) 15 MG tablet Take 1 tablet (15 mg total) by mouth daily.  30 tablet  1  . oseltamivir (TAMIFLU) 75 MG capsule Take 1 capsule (75 mg total) by mouth 2 (two) times daily.  10 capsule  0  . triamcinolone cream (KENALOG) 0.5 % Apply topically 2 (two) times daily  as needed. To rash on back      . venlafaxine XR (EFFEXOR-XR) 37.5 MG 24 hr capsule TAKE ONE CAPSULE BY MOUTH DAILY  30 capsule  11   No current facility-administered medications on file prior to visit.    Allergies  Allergen Reactions  . Egg Yolk     Family History  Problem Relation Age of Onset  . Hypertension Mother   . Heart Problems Mother     CABG  . Hypertension Father   . Heart Problems Father     CABG  . Hypertension Sister   . Heart Problems Sister   . Hypertension Sister   . Heart Problems Sister   . Hypertension Sister   . Heart Problems Sister   . Heart disease Brother     BP 122/80  Pulse 63  Temp(Src) 98.1 F (36.7 C) (Oral)  Ht 5\' 7"  (1.702 m)  Wt 162 lb (73.483 kg)  BMI 25.37 kg/m2  SpO2 98%  Review of Systems Denies weight change.     Objective:   Physical Exam VITAL SIGNS:  See vs page.  GENERAL: no distress.  Skin: not diaphoretic.   Neuro: no tremor.    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   Patient is advised the following: Patient Instructions  blood tests are being requested for you today.  We'll contact you with results.   Please come back for a follow-up appointment in 3-4 months.

## 2014-07-21 ENCOUNTER — Telehealth: Payer: Self-pay | Admitting: Endocrinology

## 2014-07-21 MED ORDER — LEVOTHYROXINE SODIUM 100 MCG PO TABS: 100.0000 ug | ORAL_TABLET | Freq: Every day | ORAL | Status: DC

## 2014-07-21 NOTE — Telephone Encounter (Signed)
Levothyroxine needs rx refill

## 2014-07-21 NOTE — Telephone Encounter (Signed)
Rx sent to pharmacy   

## 2014-08-06 ENCOUNTER — Ambulatory Visit
Admission: RE | Admit: 2014-08-06 | Discharge: 2014-08-06 | Disposition: A | Discharge status: Home / Self Care | Payer: BC Managed Care – PPO | Source: Ambulatory Visit

## 2014-08-06 DIAGNOSIS — Z1231 Encounter for screening mammogram for malignant neoplasm of breast: Secondary | ICD-10-CM

## 2014-08-15 ENCOUNTER — Other Ambulatory Visit: Payer: Self-pay | Admitting: Endocrinology

## 2014-09-12 ENCOUNTER — Ambulatory Visit (INDEPENDENT_AMBULATORY_CARE_PROVIDER_SITE_OTHER): Payer: BC Managed Care – PPO | Admitting: Physician Assistant

## 2014-09-12 VITALS — BP 140/78 | HR 98 | Temp 99.5°F | Resp 18 | Ht 67.0 in | Wt 169.6 lb

## 2014-09-12 DIAGNOSIS — J039 Acute tonsillitis, unspecified: Secondary | ICD-10-CM

## 2014-09-12 DIAGNOSIS — D72829 Elevated white blood cell count, unspecified: Secondary | ICD-10-CM

## 2014-09-12 DIAGNOSIS — J029 Acute pharyngitis, unspecified: Secondary | ICD-10-CM

## 2014-09-12 DIAGNOSIS — J302 Other seasonal allergic rhinitis: Secondary | ICD-10-CM

## 2014-09-12 DIAGNOSIS — J309 Allergic rhinitis, unspecified: Secondary | ICD-10-CM

## 2014-09-12 LAB — POCT CBC
Granulocyte percent: 87.4 %G — AB (ref 37–80)
HEMATOCRIT: 40.1 % (ref 37.7–47.9)
HEMOGLOBIN: 13.5 g/dL (ref 12.2–16.2)
LYMPH, POC: 1.4 (ref 0.6–3.4)
MCH: 31.9 pg — AB (ref 27–31.2)
MCHC: 33.7 g/dL (ref 31.8–35.4)
MCV: 94.5 fL (ref 80–97)
MID (cbc): 0.8 (ref 0–0.9)
MPV: 7.3 fL (ref 0–99.8)
POC Granulocyte: 14.9 — AB (ref 2–6.9)
POC LYMPH PERCENT: 8 %L — AB (ref 10–50)
POC MID %: 4.6 %M (ref 0–12)
Platelet Count, POC: 204 10*3/uL (ref 142–424)
RBC: 4.24 M/uL (ref 4.04–5.48)
RDW, POC: 12.5 %
WBC: 17.1 10*3/uL — AB (ref 4.6–10.2)

## 2014-09-12 LAB — POCT RAPID STREP A (OFFICE): RAPID STREP A SCREEN: NEGATIVE

## 2014-09-12 MED ORDER — CEFTRIAXONE SODIUM 1 G IJ SOLR
1.0000 g | Freq: Once | INTRAMUSCULAR | Status: AC
  Administered 2014-09-12: 1 g via INTRAMUSCULAR

## 2014-09-12 MED ORDER — FLUTICASONE PROPIONATE 50 MCG/ACT NA SUSP: 2.0000 | Freq: Every day | NASAL | Status: AC

## 2014-09-12 MED ORDER — AMOXICILLIN-POT CLAVULANATE 875-125 MG PO TABS: 1.0000 | ORAL_TABLET | Freq: Two times a day (BID) | ORAL | Status: DC

## 2014-09-12 MED ORDER — FIRST-MOUTHWASH BLM MT SUSP: OROMUCOSAL | Status: DC

## 2014-09-12 NOTE — Progress Notes (Signed)
Subjective:    Patient ID: Julie Nixon, female    DOB: [birth year], 53 y.o.   MRN: [id number]  HPI Pt presents to clinic thinking her allergies are acting up.  She started to get increase congestion and allergy symptoms 5 days ago and then had a day or to of feeling good - then yesterday she started to feel terrible with sore throat only on the right and myalgias and laid around all day because she did not feel good.  Her right ear has since started to hurt.  She was taking advil but it did not seem to be helping and then she started Tylenol and she was able to get some relief from her pain.  She has not been exposed to strep that she knows of.  She takes allergy medications daily but has been out of her Flonase and thinks that her allergies might be causing these symptoms.  Review of Systems  Constitutional: Negative for fever and chills.  HENT: Positive for congestion, ear pain (right sided) and sore throat (only on the right).   Neurological: Positive for headaches.       Objective:   Physical Exam  Vitals reviewed. Constitutional: She appears well-developed and well-nourished.  HENT:  Head: Normocephalic and atraumatic.  Right Ear: Hearing, tympanic membrane, external ear and ear canal normal.  Left Ear: Hearing, tympanic membrane, external ear and ear canal normal.  Nose: Nose normal.  Mouth/Throat: Uvula is midline. Oropharyngeal exudate (ulceration on the right tonsil, left tonsil is WNL), posterior oropharyngeal edema (no soft palate swelling, no uvula deviation) and posterior oropharyngeal erythema present. No tonsillar abscesses.  Lymphadenopathy:       Head (right side): No tonsillar and no occipital adenopathy present.       Head (left side): No tonsillar and no occipital adenopathy present.    She has cervical adenopathy.       Right cervical: Superficial cervical adenopathy present.       Left cervical: No superficial cervical adenopathy present.       Right: No  supraclavicular adenopathy present.       Left: No supraclavicular adenopathy present.      Results for orders placed in visit on 09/12/14  POCT RAPID STREP A (OFFICE)      Result Value Ref Range   Rapid Strep A Screen Negative  Negative  POCT CBC      Result Value Ref Range   WBC 17.1 (*) 4.6 - 10.2 K/uL   Lymph, poc 1.4  0.6 - 3.4   POC LYMPH PERCENT 8.0 (*) 10 - 50 %L   MID (cbc) 0.8  0 - 0.9   POC MID % 4.6  0 - 12 %M   POC Granulocyte 14.9 (*) 2 - 6.9   Granulocyte percent 87.4 (*) 37 - 80 %G   RBC 4.24  4.04 - 5.48 M/uL   Hemoglobin 13.5  12.2 - 16.2 g/dL   HCT, POC 40.9  81.1 - 47.9 %   MCV 94.5  80 - 97 fL   MCH, POC 31.9 (*) 27 - 31.2 pg   MCHC 33.7  31.8 - 35.4 g/dL   RDW, POC 91.4     Platelet Count, POC 204  142 - 424 K/uL   MPV 7.3  0 - 99.8 fL       Assessment & Plan:  Sore throat - Plan: POCT rapid strep A, POCT CBC  Tonsillitis - Plan: amoxicillin-clavulanate (AUGMENTIN) 875-125 MG per tablet, cefTRIAXone (  ROCEPHIN) injection 1 g, DPH-Lido-AlHydr-MgHydr-Simeth (FIRST-MOUTHWASH BLM) SUSP  Leukocytosis, unspecified  Seasonal allergies - Plan: fluticasone (FLONASE) 50 MCG/ACT nasal spray  We are going to treat for a tonsillitis - though there is no sign of abscess at this time with the ulceration and swelling only on the right side an early abscess is possible with the elevated white count.  We will cover with abx and see her back tomorrow if she is worse for a referral to ENT and then recheck otherwise in 2 days to make sure she is improving.  Benny Lennert PA-C  [facility] 09/12/2014 9:34 AM

## 2014-09-12 NOTE — Patient Instructions (Signed)
We will see you back tomorrow if you are worse or Tuesday as long as you are getting better for a recheck. Continue the tylenol to help you feel better.

## 2014-09-14 ENCOUNTER — Ambulatory Visit (INDEPENDENT_AMBULATORY_CARE_PROVIDER_SITE_OTHER): Payer: BC Managed Care – PPO | Admitting: Physician Assistant

## 2014-09-14 VITALS — BP 110/80 | HR 71 | Temp 97.8°F | Resp 16 | Ht 66.5 in | Wt 164.8 lb

## 2014-09-14 DIAGNOSIS — N951 Menopausal and female climacteric states: Secondary | ICD-10-CM

## 2014-09-14 DIAGNOSIS — D72829 Elevated white blood cell count, unspecified: Secondary | ICD-10-CM

## 2014-09-14 DIAGNOSIS — J039 Acute tonsillitis, unspecified: Secondary | ICD-10-CM

## 2014-09-14 DIAGNOSIS — Z780 Asymptomatic menopausal state: Secondary | ICD-10-CM

## 2014-09-14 LAB — POCT CBC
Granulocyte percent: 51.9 %G (ref 37–80)
HEMATOCRIT: 44.3 % (ref 37.7–47.9)
HEMOGLOBIN: 14.6 g/dL (ref 12.2–16.2)
Lymph, poc: 2.5 (ref 0.6–3.4)
MCH, POC: 31.2 pg (ref 27–31.2)
MCHC: 33 g/dL (ref 31.8–35.4)
MCV: 94.6 fL (ref 80–97)
MID (cbc): 1 — AB (ref 0–0.9)
MPV: 7.9 fL (ref 0–99.8)
POC Granulocyte: 3.7 (ref 2–6.9)
POC LYMPH %: 34.2 % (ref 10–50)
POC MID %: 1.9 %M (ref 0–12)
Platelet Count, POC: 228 10*3/uL (ref 142–424)
RBC: 4.68 M/uL (ref 4.04–5.48)
RDW, POC: 12.3 %
WBC: 7.2 10*3/uL (ref 4.6–10.2)

## 2014-09-14 MED ORDER — VENLAFAXINE HCL ER 37.5 MG PO CP24: ORAL_CAPSULE | ORAL | Status: DC

## 2014-09-14 NOTE — Progress Notes (Signed)
   Subjective:    Patient ID: Julie Nixon, female    DOB: 11/07/1961, 53 y.o.   MRN: [id number]  HPI  Pt presents to clinic for a recheck.  She feels much better.  No fevers or chills.  Her energy is slightly improved.  Her throat pain is almost resolved.  She does need a refill of her Effexor for her menopausal symptoms of irritability.  She know has know thyroid problems and she thinks that may have been part of it but her mood and irritability is much improved since starting on this medication.  Review of Systems     Objective:   Physical Exam  Vitals reviewed. Constitutional: She appears well-developed and well-nourished.  HENT:  Head: Normocephalic and atraumatic.  Right Ear: External ear normal.  Left Ear: External ear normal.  Mouth/Throat: Posterior oropharyngeal edema (right tonsil is still slightly enlarged but about 1/2 the size compared with visit 2 days ago.  erythema and ulcerated appearance has resolved) present. No oropharyngeal exudate, posterior oropharyngeal erythema or tonsillar abscesses.  Cardiovascular: Normal rate, regular rhythm and normal heart sounds.   No murmur heard. Pulmonary/Chest: Effort normal and breath sounds normal. She has no wheezes.  Skin: Skin is warm and dry.  Psychiatric: She has a normal mood and affect. Her behavior is normal. Judgment and thought content normal.   Results for orders placed in visit on 09/14/14  POCT CBC      Result Value Ref Range   WBC 7.2  4.6 - 10.2 K/uL   Lymph, poc 2.5  0.6 - 3.4   POC LYMPH PERCENT 34.2  10 - 50 %L   MID (cbc) 1.0 (*) 0 - 0.9   POC MID % 1.9  0 - 12 %M   POC Granulocyte 3.7  2 - 6.9   Granulocyte percent 51.9  37 - 80 %G   RBC 4.68  4.04 - 5.48 M/uL   Hemoglobin 14.6  12.2 - 16.2 g/dL   HCT, POC 40.1  02.7 - 47.9 %   MCV 94.6  80 - 97 fL   MCH, POC 31.2  27 - 31.2 pg   MCHC 33.0  31.8 - 35.4 g/dL   RDW, POC 25.3     Platelet Count, POC 228  142 - 424 K/uL   MPV 7.9  0 - 99.8 fL         Assessment & Plan:  Leukocytosis, unspecified - Plan: POCT CBC  Tonsillitis - improved - she will finish her abx.  Menopause - We will continue this medication - she has not had a period since 09/2013 - in a year we will decided if we want to wean off the medication.  She agrees.  Plan: venlafaxine XR (EFFEXOR-XR) 37.5 MG 24 hr capsule  Benny Lennert PA-C  [facility] 09/14/2014 6:43 PM

## 2014-09-20 ENCOUNTER — Other Ambulatory Visit: Payer: Self-pay | Admitting: Endocrinology

## 2014-09-30 ENCOUNTER — Encounter: Payer: BC Managed Care – PPO | Admitting: Emergency Medicine

## 2014-10-01 ENCOUNTER — Other Ambulatory Visit: Payer: Self-pay | Admitting: Emergency Medicine

## 2014-10-03 ENCOUNTER — Telehealth: Payer: Self-pay

## 2014-10-03 DIAGNOSIS — E785 Hyperlipidemia, unspecified: Secondary | ICD-10-CM

## 2014-10-03 NOTE — Telephone Encounter (Signed)
Patient called requesting a refill on Atorvastatin 10 MG. [facility] [address] [phone]

## 2014-10-04 MED ORDER — ATORVASTATIN CALCIUM 10 MG PO TABS: 10.0000 mg | ORAL_TABLET | Freq: Every day | ORAL | Status: DC

## 2014-10-04 NOTE — Telephone Encounter (Signed)
Sent in #30 pt has a CPE in October with Dr. Cleta Albertsaub

## 2014-10-12 ENCOUNTER — Other Ambulatory Visit: Payer: Self-pay | Admitting: Emergency Medicine

## 2014-10-12 ENCOUNTER — Encounter: Payer: Self-pay | Admitting: Emergency Medicine

## 2014-10-12 ENCOUNTER — Ambulatory Visit (INDEPENDENT_AMBULATORY_CARE_PROVIDER_SITE_OTHER): Payer: BC Managed Care – PPO | Admitting: Emergency Medicine

## 2014-10-12 ENCOUNTER — Ambulatory Visit (INDEPENDENT_AMBULATORY_CARE_PROVIDER_SITE_OTHER): Payer: BC Managed Care – PPO

## 2014-10-12 VITALS — BP 124/78 | HR 57 | Temp 98.1°F | Resp 16 | Ht 66.5 in | Wt 165.0 lb

## 2014-10-12 DIAGNOSIS — Z8249 Family history of ischemic heart disease and other diseases of the circulatory system: Secondary | ICD-10-CM

## 2014-10-12 DIAGNOSIS — Z0000 Encounter for general adult medical examination without abnormal findings: Secondary | ICD-10-CM

## 2014-10-12 DIAGNOSIS — E785 Hyperlipidemia, unspecified: Secondary | ICD-10-CM

## 2014-10-12 DIAGNOSIS — F17200 Nicotine dependence, unspecified, uncomplicated: Secondary | ICD-10-CM

## 2014-10-12 DIAGNOSIS — Z720 Tobacco use: Secondary | ICD-10-CM

## 2014-10-12 DIAGNOSIS — Z780 Asymptomatic menopausal state: Secondary | ICD-10-CM

## 2014-10-12 DIAGNOSIS — N9089 Other specified noninflammatory disorders of vulva and perineum: Secondary | ICD-10-CM

## 2014-10-12 LAB — COMPLETE METABOLIC PANEL WITHOUT GFR
ALT: 22 U/L (ref 0–35)
AST: 20 U/L (ref 0–37)
Albumin: 4.2 g/dL (ref 3.5–5.2)
Alkaline Phosphatase: 55 U/L (ref 39–117)
BUN: 11 mg/dL (ref 6–23)
CO2: 26 meq/L (ref 19–32)
Calcium: 9.4 mg/dL (ref 8.4–10.5)
Chloride: 103 meq/L (ref 96–112)
Creat: 0.7 mg/dL (ref 0.50–1.10)
GFR, Est African American: >89 mL/min
GFR, Est Non African American: >89 mL/min
Glucose, Bld: 92 mg/dL (ref 70–99)
Potassium: 4.3 meq/L (ref 3.5–5.3)
Sodium: 140 meq/L (ref 135–145)
Total Bilirubin: 0.5 mg/dL (ref 0.2–1.2)
Total Protein: 7 g/dL (ref 6.0–8.3)

## 2014-10-12 LAB — POCT UA - MICROSCOPIC ONLY
Casts, Ur, LPF, POC: NEGATIVE
Mucus, UA: POSITIVE
Yeast, UA: NEGATIVE

## 2014-10-12 LAB — CBC WITH DIFFERENTIAL/PLATELET
Basophils Absolute: 0.1 10*3/uL (ref 0.0–0.1)
Basophils Relative: 1 % (ref 0–1)
Eosinophils Absolute: 0.6 10*3/uL (ref 0.0–0.7)
Eosinophils Relative: 10 % — ABNORMAL HIGH (ref 0–5)
HCT: 42.9 % (ref 36.0–46.0)
Hemoglobin: 14.9 g/dL (ref 12.0–15.0)
Lymphocytes Relative: 44 % (ref 12–46)
Lymphs Abs: 2.8 10*3/uL (ref 0.7–4.0)
MCH: 31.2 pg (ref 26.0–34.0)
MCHC: 34.7 g/dL (ref 30.0–36.0)
MCV: 89.9 fL (ref 78.0–100.0)
Monocytes Absolute: 0.6 10*3/uL (ref 0.1–1.0)
Monocytes Relative: 10 % (ref 3–12)
Neutro Abs: 2.2 10*3/uL (ref 1.7–7.7)
Neutrophils Relative %: 35 % — ABNORMAL LOW (ref 43–77)
Platelets: 266 10*3/uL (ref 150–400)
RBC: 4.77 MIL/uL (ref 3.87–5.11)
RDW: 12.7 % (ref 11.5–15.5)
WBC: 6.3 10*3/uL (ref 4.0–10.5)

## 2014-10-12 LAB — POCT URINALYSIS DIPSTICK
Bilirubin, UA: NEGATIVE
Blood, UA: NEGATIVE
Glucose, UA: NEGATIVE
Ketones, UA: NEGATIVE
Nitrite, UA: NEGATIVE
Protein, UA: NEGATIVE
Spec Grav, UA: 1.02
Urobilinogen, UA: 0.2
pH, UA: 6

## 2014-10-12 LAB — POCT URINE PREGNANCY: Preg Test, Ur: NEGATIVE

## 2014-10-12 LAB — LIPID PANEL
CHOLESTEROL: 179 mg/dL (ref 0–200)
HDL: 55 mg/dL (ref >39)
LDL Cholesterol: 99 mg/dL (ref 0–99)
TRIGLYCERIDES: 124 mg/dL (ref <150)
Total CHOL/HDL Ratio: 3.3 Ratio
VLDL: 25 mg/dL (ref 0–40)

## 2014-10-12 MED ORDER — VENLAFAXINE HCL ER 37.5 MG PO CP24: ORAL_CAPSULE | ORAL | Status: DC

## 2014-10-12 NOTE — Progress Notes (Addendum)
Subjective:  This chart was scribe for Lesle ChrisSteven Daub, MD by Angelene GiovanniEmmanuella Mensah, ED Scribe. The patient was seen in Room 22 and the patient's care was started at 2:31 PM.   Patient ID: Julie Nixon, female    DOB: April 01, 1961, 53 y.o.   MRN: [id number]  HPI HPI Comments: Julie Nixon is a 53 y.o. female who presents to the [facility] for a complete physical. 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 She reports that she had Tonsillitis last month and she states that she has not had it since she was a teenager. However, she attributes her tonsillitis to previous allergies. She also reports that her tonsillitis was resolved with antibiotics and an allergy shot. She had a colonoscopy in March by Dr. Jeani HawkingPatrick Hung, that revealed two polyps- one of which was pre cancerous. Her next colonoscopy is scheduled in 5 years. She also states that she had a mammogram in August. She also reports that she was seen by Dr. Rennis GoldenHilty due to her family history of cardiac problems. She was not able to have a calcium score CT due to insurance problems. She states that she smokes half a pack of cigarattes a day.   Her hypothyroidism is managed by Dr. Everardo AllEllison. She has a history of radioactive iodine treatment for her thyroid.    Past Medical History  Diagnosis Date  . Anxiety   . Allergy   . Asthma   . Hyperlipidemia   . Thyroid disease    Past Surgical History  Procedure Laterality Date  . Mouth surgery     Family History  Problem Relation Age of Onset  . Hypertension Mother   . Heart Problems Mother     CABG  . Hyperlipidemia Mother   . Hypertension Father   . Heart Problems Father     CABG  . Hyperlipidemia Father   . Hypertension Sister   . Heart Problems Sister   . Hypertension Sister   . Heart Problems Sister   . Hypertension Sister   . Heart Problems Sister   . Heart disease Brother   . Hyperlipidemia Brother   . Hypertension  Brother    History   Social History  . Marital Status: Single    Spouse Name: N/A    Number of Children: N/A  . 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 Smoking status: Current Every Day Smoker -- 1.00 packs/day for 31 years    Types: Cigarettes    Last Attempt to Quit: 08/06/2012  . Smokeless tobacco: Never Used     Comment: trying to quit - on & off for 3 years - down to 1/2 ppd  . Alcohol Use: No  . Drug Use: No  . Sexual Activity: No   Other Topics Concern  . Not on file   Social History Narrative   Divorced. Education: [employer]. Exercise: Elliptical/swim 3 times a week for 30 minutes.   Ex-husband addicted to narcotics and is anxious about becoming addicted to any type of medication   Allergies  Allergen Reactions  . Egg Yolk      Review of Systems     Objective:   Physical Exam  Nursing note and vitals reviewed. Constitutional: She is oriented to person, place, and time. She appears well-developed and well-nourished.  HENT:  Head: Normocephalic and atraumatic.  Right Ear: External ear normal.  Left Ear: External ear normal.  Mouth/Throat: Oropharynx is clear and moist. No oropharyngeal exudate.  Eyes: EOM are normal. Pupils are equal, round, and reactive to light.  Cardiovascular: Normal rate, regular rhythm, normal heart sounds and intact distal pulses.   No murmur heard. Pulmonary/Chest: Effort normal and breath sounds normal. No respiratory distress. She has no wheezes. She has no rales.  Abdominal: Soft. Bowel sounds are normal. She exhibits no distension and no mass. There is no tenderness. There is no rebound and no guarding.  Neurological: She is alert and oriented to person, place, and time.  Skin: Skin is warm and dry.  Psychiatric: She has a normal mood and affect.  GYN there is a 1 x 1 cm flat irregular lesion superior to left labia minora which is slightly  tender to touch  [facility] reading (PRIMARY) by  Dr. Cleta Albertsaub no acute disease      BP 124/78  Pulse 57  Temp(Src) 98.1 F (36.7 C)  Resp 16  Ht 5' 6.5" (1.689 m)  Wt 165 lb (74.844 kg)  BMI 26.24 kg/m2  SpO2 95%  LMP 10/27/2013  Assessment & Plan:  I personally performed the services described in this documentation, which was scribed in my presence. The recorded information has been reviewed and is accurate. Referral made to GYN for the abnormal area seen on pelvic exam. I did add an RPR and HIV test because of the lesion found on pelvic. She does have a new partner for the last 3 months. She is going to work on smoking cessation. She will contact Dr. Rennis GoldenHilty regarding cardiac evaluation. She will try and go next door and have her flu shot. She was again encouraged to work on smoking cessation .

## 2014-10-13 LAB — RPR

## 2014-10-13 LAB — HIV ANTIBODY (ROUTINE TESTING W REFLEX): HIV: NONREACTIVE

## 2014-10-14 ENCOUNTER — Other Ambulatory Visit: Payer: Self-pay | Admitting: Emergency Medicine

## 2014-10-14 DIAGNOSIS — R87611 Atypical squamous cells cannot exclude high grade squamous intraepithelial lesion on cytologic smear of cervix (ASC-H): Secondary | ICD-10-CM

## 2014-10-14 LAB — PAP IG AND CT-NG NAA
CHLAMYDIA PROBE AMP: NEGATIVE
GC PROBE AMP: NEGATIVE

## 2014-10-18 ENCOUNTER — Ambulatory Visit (INDEPENDENT_AMBULATORY_CARE_PROVIDER_SITE_OTHER): Payer: BC Managed Care – PPO | Admitting: Endocrinology

## 2014-10-18 ENCOUNTER — Encounter: Payer: Self-pay | Admitting: Endocrinology

## 2014-10-18 VITALS — BP 128/78 | HR 85 | Temp 98.6°F | Ht 66.5 in | Wt 169.0 lb

## 2014-10-18 DIAGNOSIS — E890 Postprocedural hypothyroidism: Secondary | ICD-10-CM

## 2014-10-18 LAB — HUMAN PAPILLOMAVIRUS, HIGH RISK: HPV DNA HIGH RISK: DETECTED — AB

## 2014-10-18 NOTE — Patient Instructions (Addendum)
blood tests are being requested for you today.  We'll contact you with results. Please come back for a follow-up appointment in 4 months.    

## 2014-10-18 NOTE — Progress Notes (Signed)
Subjective:    Patient ID: Julie Nixon, female    DOB: 05/04/1961, 53 y.o.   MRN: [id number]  HPI In late 2014, pt was dx'ed with hyperthyroidism, due to grave's dz.  She took I-131 rx in January of 2015.  In April of 2015, she was noted to be hypothyroid, and was rx'ed synthroid.  pt states she feels well in general, except for weight gain.  Past Medical History  Diagnosis Date  . Anxiety   . Allergy   . Asthma   . Hyperlipidemia   . Thyroid disease     Past Surgical History  Procedure Laterality Date  . Mouth surgery      History   Social History  . Marital Status: Single    Spouse Name: N/A    Number of Children: N/A  . 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 Smoking status: Current Every Day Smoker -- 1.00 packs/day for 31 years    Types: Cigarettes    Last Attempt to Quit: 08/06/2012  . Smokeless tobacco: Never Used     Comment: trying to quit - on & off for 3 years - down to 1/2 ppd  . Alcohol Use: No  . Drug Use: No  . Sexual Activity: No   Other Topics Concern  . Not on file   Social History Narrative   Divorced. Education: [employer]. Exercise: Elliptical/swim 3 times a week for 30 minutes.   Ex-husband addicted to narcotics and is anxious about becoming addicted to any type of medication    Current Outpatient Prescriptions on File Prior to Visit  Medication Sig Dispense Refill  . albuterol (PROVENTIL HFA;VENTOLIN HFA) 108 (90 BASE) MCG/ACT inhaler Inhale 2 puffs into the lungs every 6 (six) hours as needed for wheezing.  18 g  2  . atorvastatin (LIPITOR) 10 MG tablet Take 1 tablet (10 mg total) by mouth daily.  30 tablet  0  . azelastine (ASTELIN) 137 MCG/SPRAY nasal spray Place 2 sprays into the nose 2 (two) times daily. Use in each nostril as directed  30 mL  11  . beclomethasone (QVAR) 80 MCG/ACT inhaler Inhale 1 puff into the lungs 2 (two) times daily as  needed.      . fexofenadine-pseudoephedrine (ALLEGRA-D 24) 180-240 MG per 24 hr tablet Take 1 tablet by mouth daily.  30 tablet  11  . fluticasone (FLONASE) 50 MCG/ACT nasal spray Place 2 sprays into both nostrils daily.  16 g  11  . levothyroxine (SYNTHROID, LEVOTHROID) 100 MCG tablet TAKE 1 TABLET BY MOUTH DAILY BEFORE BREAKFAST  30 tablet  0  . meloxicam (MOBIC) 15 MG tablet Take 1 tablet (15 mg total) by mouth daily.  30 tablet  1  . venlafaxine XR (EFFEXOR-XR) 37.5 MG 24 hr capsule TAKE ONE CAPSULE BY MOUTH DAILY  30 capsule  11   No current facility-administered medications on file prior to visit.    Allergies  Allergen Reactions  . Egg Yolk     Family History  Problem Relation Age of Onset  . Hypertension Mother   . Heart Problems Mother     CABG  . Hyperlipidemia Mother   . Hypertension Father   . Heart Problems Father     CABG  . Hyperlipidemia Father   . Hypertension Sister   . Heart Problems Sister   . Hypertension Sister   . Heart  Problems Sister   . Hypertension Sister   . Heart Problems Sister   . Heart disease Brother   . Hyperlipidemia Brother   . Hypertension Brother     BP 128/78  Pulse 85  Temp(Src) 98.6 F (37 C) (Oral)  Ht 5' 6.5" (1.689 m)  Wt 169 lb (76.658 kg)  BMI 26.87 kg/m2  SpO2 95%  LMP 10/27/2013  Review of Systems Denies edema    Objective:   Physical Exam VITAL SIGNS:  See vs page GENERAL: no distress NECK: There is no palpable thyroid enlargement.  No thyroid nodule is palpable.  No palpable lymphadenopathy at the anterior neck.      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    Patient is advised the following: Patient Instructions  blood tests are being requested for you today.  We'll contact you with results.   Please come back for a follow-up appointment in 4 months.

## 2014-10-19 ENCOUNTER — Other Ambulatory Visit: Payer: Self-pay | Admitting: Endocrinology

## 2014-10-19 LAB — TSH: TSH: 3.13 u[IU]/mL (ref 0.35–4.50)

## 2014-11-13 ENCOUNTER — Other Ambulatory Visit: Payer: Self-pay | Admitting: Endocrinology

## 2014-12-02 ENCOUNTER — Other Ambulatory Visit: Payer: Self-pay | Admitting: Emergency Medicine

## 2014-12-14 ENCOUNTER — Other Ambulatory Visit: Payer: Self-pay | Admitting: Endocrinology

## 2015-01-16 ENCOUNTER — Other Ambulatory Visit: Payer: Self-pay | Admitting: Endocrinology

## 2015-01-31 ENCOUNTER — Other Ambulatory Visit: Payer: Self-pay | Admitting: Physician Assistant

## 2015-02-10 ENCOUNTER — Other Ambulatory Visit: Payer: Self-pay | Admitting: Endocrinology

## 2015-02-16 ENCOUNTER — Encounter: Payer: Self-pay | Admitting: Endocrinology

## 2015-02-16 ENCOUNTER — Ambulatory Visit (INDEPENDENT_AMBULATORY_CARE_PROVIDER_SITE_OTHER): Payer: BC Managed Care – PPO | Admitting: Endocrinology

## 2015-02-16 VITALS — BP 122/80 | HR 91 | Temp 97.0°F | Ht 66.5 in | Wt 177.0 lb

## 2015-02-16 DIAGNOSIS — E890 Postprocedural hypothyroidism: Secondary | ICD-10-CM

## 2015-02-16 NOTE — Progress Notes (Signed)
Subjective:    Patient ID: Julie Nixon, female    DOB: 1961/09/28, 54 y.o.   MRN: [id number]  HPI In late 2014, pt was dx'ed with hyperthyroidism, due to grave's dz.  She took I-131 rx in January of 2015.  In April of 2015, she was noted to be hypothyroid, and was rx'ed synthroid.  pt states she feels well in general, except for ongoing weight gain.  Past Medical History  Diagnosis Date  . Anxiety   . Allergy   . Asthma   . Hyperlipidemia   . Thyroid disease     Past Surgical History  Procedure Laterality Date  . Mouth surgery      History   Social History  . Marital Status: Single    Spouse Name: N/A  . Number of Children: N/A  . 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 Smoking status: Current Every Day Smoker -- 1.00 packs/day for 31 years    Types: Cigarettes    Last Attempt to Quit: 08/06/2012  . Smokeless tobacco: Never Used     Comment: trying to quit - on & off for 3 years - down to 1/2 ppd  . Alcohol Use: No  . Drug Use: No  . Sexual Activity: No   Other Topics Concern  . Not on file   Social History Narrative   Divorced. Education: [employer]. Exercise: Elliptical/swim 3 times a week for 30 minutes.   Ex-husband addicted to narcotics and is anxious about becoming addicted to any type of medication    Current Outpatient Prescriptions on File Prior to Visit  Medication Sig Dispense Refill  . albuterol (PROVENTIL HFA;VENTOLIN HFA) 108 (90 BASE) MCG/ACT inhaler Inhale 2 puffs into the lungs every 6 (six) hours as needed for wheezing. 18 g 2  . atorvastatin (LIPITOR) 10 MG tablet TAKE 1 TABLET BY MOUTH DAILY 30 tablet 2  . azelastine (ASTELIN) 137 MCG/SPRAY nasal spray Place 2 sprays into the nose 2 (two) times daily. Use in each nostril as directed 30 mL 11  . beclomethasone (QVAR) 80 MCG/ACT inhaler Inhale 1 puff into the lungs 2 (two) times daily as needed.    .  fexofenadine-pseudoephedrine (ALLEGRA-D 24) 180-240 MG per 24 hr tablet Take 1 tablet by mouth daily. 30 tablet 11  . fluticasone (FLONASE) 50 MCG/ACT nasal spray Place 2 sprays into both nostrils daily. 16 g 11  . meloxicam (MOBIC) 15 MG tablet Take 1 tablet (15 mg total) by mouth daily. 30 tablet 1  . venlafaxine XR (EFFEXOR-XR) 37.5 MG 24 hr capsule TAKE ONE CAPSULE BY MOUTH DAILY 30 capsule 11   No current facility-administered medications on file prior to visit.    Allergies  Allergen Reactions  . Egg Yolk     Family History  Problem Relation Age of Onset  . Hypertension Mother   . Heart Problems Mother     CABG  . Hyperlipidemia Mother   . Hypertension Father   . Heart Problems Father     CABG  . Hyperlipidemia Father   . Hypertension Sister   . Heart Problems Sister   . Hypertension Sister   . Heart Problems Sister   . Hypertension Sister   . Heart Problems Sister   . Heart disease Brother   . Hyperlipidemia Brother   . Hypertension Brother     BP 122/80 mmHg  Pulse 91  Temp(Src) 97 F (36.1 C)  Ht 5' 6.5" (1.689 m)  Wt 177 lb (80.287 kg)  BMI 28.14 kg/m2  SpO2 95%  LMP 10/27/2013    Review of Systems She denies fatigue    Objective:   Physical Exam VITAL SIGNS:  See vs page GENERAL: no distress NECK: There is no palpable thyroid enlargement.  No thyroid nodule is palpable.  No palpable lymphadenopathy at the anterior neck.     Lab Results  Component Value Date   TSH 5.94* 02/16/2015      Assessment & Plan:  Hypothyroidism: she needs increased rx  Patient is advised the following: Patient Instructions  blood tests are being requested for you today.  We'll contact you with results.   Please come back for a follow-up appointment in 6 months.

## 2015-02-16 NOTE — Patient Instructions (Signed)
blood tests are being requested for you today.  We'll contact you with results. Please come back for a follow-up appointment in 6 months  

## 2015-02-17 LAB — TSH: TSH: 5.94 u[IU]/mL — ABNORMAL HIGH (ref 0.35–4.50)

## 2015-02-17 MED ORDER — LEVOTHYROXINE SODIUM 125 MCG PO TABS: 125.0000 ug | ORAL_TABLET | Freq: Every day | ORAL | Status: DC

## 2015-03-17 ENCOUNTER — Other Ambulatory Visit: Payer: Self-pay | Admitting: Endocrinology

## 2015-08-01 ENCOUNTER — Other Ambulatory Visit: Payer: Self-pay | Admitting: Emergency Medicine

## 2015-08-04 ENCOUNTER — Other Ambulatory Visit: Payer: Self-pay

## 2015-08-04 DIAGNOSIS — Z1231 Encounter for screening mammogram for malignant neoplasm of breast: Secondary | ICD-10-CM

## 2015-08-15 ENCOUNTER — Ambulatory Visit
Admission: RE | Admit: 2015-08-15 | Discharge: 2015-08-15 | Disposition: A | Discharge status: Home / Self Care | Payer: BC Managed Care – PPO | Source: Ambulatory Visit

## 2015-08-15 DIAGNOSIS — Z1231 Encounter for screening mammogram for malignant neoplasm of breast: Secondary | ICD-10-CM

## 2015-08-16 ENCOUNTER — Encounter: Payer: Self-pay | Admitting: Endocrinology

## 2015-08-16 ENCOUNTER — Ambulatory Visit (INDEPENDENT_AMBULATORY_CARE_PROVIDER_SITE_OTHER): Payer: BC Managed Care – PPO | Admitting: Endocrinology

## 2015-08-16 VITALS — BP 124/88 | HR 70 | Temp 98.0°F | Ht 66.5 in | Wt 175.0 lb

## 2015-08-16 DIAGNOSIS — E890 Postprocedural hypothyroidism: Secondary | ICD-10-CM | POA: Diagnosis not present

## 2015-08-16 LAB — TSH: TSH: 0.31 u[IU]/mL — ABNORMAL LOW (ref 0.35–4.50)

## 2015-08-16 MED ORDER — LEVOTHYROXINE SODIUM 112 MCG PO TABS: 112.0000 ug | ORAL_TABLET | Freq: Every day | ORAL | Status: DC

## 2015-08-16 NOTE — Progress Notes (Signed)
Subjective:    Patient ID: Julie Nixon, female    DOB: 26-Aug-1961, 54 y.o.   MRN: [id number]  HPI Pt returns for f/u of post-I-131 hypothyroidism (in late 2014, pt was dx'ed with hyperthyroidism, due to grave's dz.  She took I-131 rx in January of 2015.  In April of 2015, she was noted to be hypothyroid, and was rx'ed synthroid).  pt states she feels well in general, except for fatigue Past Medical History  Diagnosis Date  . Anxiety   . Allergy   . Asthma   . Hyperlipidemia   . Thyroid disease     Past Surgical History  Procedure Laterality Date  . Mouth surgery      Social History   Social History  . Marital Status: Single    Spouse Name: N/A  . Number of Children: N/A  . 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 Smoking status: Current Every Day Smoker -- 1.00 packs/day for 31 years    Types: Cigarettes    Last Attempt to Quit: 08/06/2012  . Smokeless tobacco: Never Used     Comment: trying to quit - on & off for 3 years - down to 1/2 ppd  . Alcohol Use: No  . Drug Use: No  . Sexual Activity: No   Other Topics Concern  . Not on file   Social History Narrative   Divorced. Education: [employer]. Exercise: Elliptical/swim 3 times a week for 30 minutes.   Ex-husband addicted to narcotics and is anxious about becoming addicted to any type of medication    Current Outpatient Prescriptions on File Prior to Visit  Medication Sig Dispense Refill  . albuterol (PROVENTIL HFA;VENTOLIN HFA) 108 (90 BASE) MCG/ACT inhaler Inhale 2 puffs into the lungs every 6 (six) hours as needed for wheezing. 18 g 2  . atorvastatin (LIPITOR) 10 MG tablet TAKE 1 TABLET BY MOUTH DAILY.  "OV NEEDED" 30 tablet 2  . azelastine (ASTELIN) 137 MCG/SPRAY nasal spray Place 2 sprays into the nose 2 (two) times daily. Use in each nostril as directed 30 mL 11  . beclomethasone (QVAR) 80 MCG/ACT inhaler Inhale 1 puff  into the lungs 2 (two) times daily as needed.    Marland Kitchen estrogens, conjugated, (PREMARIN) 0.45 MG tablet Take 0.45 mg by mouth daily. Take daily for 21 days then do not take for 7 days.    . fexofenadine-pseudoephedrine (ALLEGRA-D 24) 180-240 MG per 24 hr tablet Take 1 tablet by mouth daily. 30 tablet 11  . fluticasone (FLONASE) 50 MCG/ACT nasal spray Place 2 sprays into both nostrils daily. 16 g 11  . folic acid (FOLVITE) 1 MG tablet Take 1 mg by mouth daily.    . meloxicam (MOBIC) 15 MG tablet Take 1 tablet (15 mg total) by mouth daily. 30 tablet 1  . venlafaxine XR (EFFEXOR-XR) 37.5 MG 24 hr capsule TAKE ONE CAPSULE BY MOUTH DAILY 30 capsule 11   No current facility-administered medications on file prior to visit.    Allergies  Allergen Reactions  . Egg Yolk     Family History  Problem Relation Age of Onset  . Hypertension Mother   . Heart Problems Mother     CABG  . Hyperlipidemia Mother   . Hypertension Father   . Heart Problems Father     CABG  . Hyperlipidemia Father   . Hypertension Sister   .  Heart Problems Sister   . Hypertension Sister   . Heart Problems Sister   . Hypertension Sister   . Heart Problems Sister   . Heart disease Brother   . Hyperlipidemia Brother   . Hypertension Brother     BP 124/88 mmHg  Pulse 70  Temp(Src) 98 F (36.7 C) (Oral)  Ht 5' 6.5" (1.689 m)  Wt 175 lb (79.379 kg)  BMI 27.83 kg/m2  SpO2 96%  LMP 10/27/2013    Review of Systems Denies excessive diaphoresis.     Objective:   Physical Exam VITAL SIGNS:  See vs page GENERAL: no distress Skin: not diaphoretic Neuro: no tremor  Lab Results  Component Value Date   TSH 0.31* 08/16/2015       Assessment & Plan:  Hypothyroidism, slightly overreplaced  Patient is advised the following: Patient Instructions  blood tests are being requested for you today.  We'll contact you with results.   I would be happy to see you back here as needed.

## 2015-08-16 NOTE — Patient Instructions (Addendum)
blood tests are being requested for you today.  We'll contact you with results.   I would be happy to see you back here as needed.

## 2015-10-04 ENCOUNTER — Encounter: Payer: Self-pay | Admitting: Emergency Medicine

## 2015-10-12 ENCOUNTER — Other Ambulatory Visit: Payer: Self-pay | Admitting: Emergency Medicine

## 2015-10-18 ENCOUNTER — Encounter: Payer: BC Managed Care – PPO | Admitting: Emergency Medicine

## 2015-12-08 ENCOUNTER — Ambulatory Visit (INDEPENDENT_AMBULATORY_CARE_PROVIDER_SITE_OTHER): Payer: BC Managed Care – PPO | Admitting: Emergency Medicine

## 2015-12-08 ENCOUNTER — Encounter: Payer: Self-pay | Admitting: Emergency Medicine

## 2015-12-08 VITALS — BP 122/81 | HR 69 | Temp 97.9°F | Resp 16 | Ht 67.0 in | Wt 175.0 lb

## 2015-12-08 DIAGNOSIS — E785 Hyperlipidemia, unspecified: Secondary | ICD-10-CM

## 2015-12-08 DIAGNOSIS — Z720 Tobacco use: Secondary | ICD-10-CM | POA: Diagnosis not present

## 2015-12-08 DIAGNOSIS — Z1159 Encounter for screening for other viral diseases: Secondary | ICD-10-CM

## 2015-12-08 DIAGNOSIS — E038 Other specified hypothyroidism: Secondary | ICD-10-CM

## 2015-12-08 DIAGNOSIS — F17200 Nicotine dependence, unspecified, uncomplicated: Secondary | ICD-10-CM

## 2015-12-08 DIAGNOSIS — Z0000 Encounter for general adult medical examination without abnormal findings: Secondary | ICD-10-CM

## 2015-12-08 DIAGNOSIS — R8290 Unspecified abnormal findings in urine: Secondary | ICD-10-CM

## 2015-12-08 LAB — CBC WITH DIFFERENTIAL/PLATELET
BASOS ABS: 0.1 10*3/uL (ref 0.0–0.1)
Basophils Relative: 1 % (ref 0–1)
EOS ABS: 0.6 10*3/uL (ref 0.0–0.7)
EOS PCT: 8 % — AB (ref 0–5)
HEMATOCRIT: 40.1 % (ref 36.0–46.0)
Hemoglobin: 14 g/dL (ref 12.0–15.0)
LYMPHS ABS: 2.6 10*3/uL (ref 0.7–4.0)
LYMPHS PCT: 36 % (ref 12–46)
MCH: 31.4 pg (ref 26.0–34.0)
MCHC: 34.9 g/dL (ref 30.0–36.0)
MCV: 89.9 fL (ref 78.0–100.0)
MPV: 10.2 fL (ref 8.6–12.4)
Monocytes Absolute: 0.7 10*3/uL (ref 0.1–1.0)
Monocytes Relative: 10 % (ref 3–12)
NEUTROS PCT: 45 % (ref 43–77)
Neutro Abs: 3.2 10*3/uL (ref 1.7–7.7)
PLATELETS: 285 10*3/uL (ref 150–400)
RBC: 4.46 MIL/uL (ref 3.87–5.11)
RDW: 13.1 % (ref 11.5–15.5)
WBC: 7.1 10*3/uL (ref 4.0–10.5)

## 2015-12-08 LAB — THYROID PANEL WITH TSH
FREE THYROXINE INDEX: 2.9 (ref 1.4–3.8)
T3 UPTAKE: 34 % (ref 22–35)
T4 TOTAL: 8.5 ug/dL (ref 4.5–12.0)
TSH: 1.853 u[IU]/mL (ref 0.350–4.500)

## 2015-12-08 LAB — COMPLETE METABOLIC PANEL WITH GFR
ALK PHOS: 48 U/L (ref 33–130)
ALT: 19 U/L (ref 6–29)
AST: 18 U/L (ref 10–35)
Albumin: 3.8 g/dL (ref 3.6–5.1)
BILIRUBIN TOTAL: 0.5 mg/dL (ref 0.2–1.2)
BUN: 11 mg/dL (ref 7–25)
CALCIUM: 8.9 mg/dL (ref 8.6–10.4)
CO2: 25 mmol/L (ref 20–31)
CREATININE: 0.62 mg/dL (ref 0.50–1.05)
Chloride: 104 mmol/L (ref 98–110)
Glucose, Bld: 99 mg/dL (ref 65–99)
Potassium: 4.1 mmol/L (ref 3.5–5.3)
Sodium: 138 mmol/L (ref 135–146)
TOTAL PROTEIN: 6.5 g/dL (ref 6.1–8.1)

## 2015-12-08 LAB — LIPID PANEL
CHOLESTEROL: 160 mg/dL (ref 125–200)
HDL: 48 mg/dL (ref >=46)
LDL Cholesterol: 77 mg/dL (ref <130)
TRIGLYCERIDES: 173 mg/dL — AB (ref <150)
Total CHOL/HDL Ratio: 3.3 Ratio (ref <=5.0)
VLDL: 35 mg/dL — ABNORMAL HIGH (ref <30)

## 2015-12-08 LAB — POCT URINALYSIS DIP (MANUAL ENTRY)
Bilirubin, UA: NEGATIVE
Glucose, UA: NEGATIVE
Ketones, POC UA: NEGATIVE
Nitrite, UA: POSITIVE — AB
Protein Ur, POC: NEGATIVE
SPEC GRAV UA: 1.02
UROBILINOGEN UA: 0.2
pH, UA: 7

## 2015-12-08 NOTE — Progress Notes (Addendum)
Subjective:  This chart was scribed for Julie Nixon Julie Xylia Scherger, MD by Julie Nixon, at [facility].  This patient was seen in room 22 and the patient's care was started at 8:40 AM.    Patient ID: Julie Nixon, female    DOB: 1961-10-18, 54 y.o.   MRN: [id number] Chief Complaint  Patient presents with  . Annual Exam    HPI  HPI Comments: Julie Nixon is Julie 53 y.o. female who presents to the [facility] for an annual physical exam. She is complaining of an intermittent "tinge" sensation on top of her patellar tendon but states that it does not bother her.  Patient is willing to start icing it so it doesn't bother her as much.  She does not exercise regularly at the moment but was exercising often (5 times/ week) before she got the flu.  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     Thyroid: She would like her thyroid levels checked today and would like the results sent to her  Send Julie Nixon her thyroid results.   Immunizations: She has not yet been immunized for pneumonia and has not yet had the flu vaccination.   Smoking- Patient states that she is not "mentally" prepared to quit smoking.  She does not feel like her efforts have been going well and doesn't feel like the patch has been helping her.   Patient has an OBGYN ( Julie Nixon) and is up to date with her colonoscopy -2 small polyps were found and Julie repeat was recommended in 2-5 years. She has been taking her folic acid as well as vitamin B supplements at night.  She uses premarin (2 times Julie week). She had Julie normal CXR 1 year ago.         Patient Active Problem List   Diagnosis Date Noted  . Hypothyroidism, postradioiodine therapy 10/18/2014  . Family history of premature CAD 10/22/2013  . Menopause 09/22/2013  . Extrinsic asthma, unspecified 09/22/2013  . Smoker 09/22/2013  . Allergic rhinitis 08/19/2013  . Hyperlipidemia 10/11/2012   Past Medical History  Diagnosis Date  . Anxiety   .  Allergy   . Asthma   . Hyperlipidemia   . Thyroid disease    Past Surgical History  Procedure Laterality Date  . Mouth surgery     Allergies  Allergen Reactions  . Egg Yolk    Prior to Admission medications   Medication Sig Start Date End Date Taking? Authorizing Provider  albuterol (PROVENTIL HFA;VENTOLIN HFA) 108 (90 BASE) MCG/ACT inhaler Inhale 2 puffs into the lungs every 6 (six) hours as needed for wheezing. 09/22/13   Julie Gobble, MD  atorvastatin (LIPITOR) 10 MG tablet TAKE 1 TABLET BY MOUTH DAILY.  "OV NEEDED" 08/03/15   Julie Riddle, PA-C  azelastine (ASTELIN) 137 MCG/SPRAY nasal spray Place 2 sprays into the nose 2 (two) times daily. Use in each nostril as directed 09/22/13   Julie Gobble, MD  beclomethasone (QVAR) 80 MCG/ACT inhaler Inhale 1 puff into the lungs 2 (two) times daily as needed. 09/22/13   Julie Gobble, MD  estrogens, conjugated, (PREMARIN) 0.45 MG tablet Take 0.45 mg by mouth daily. Take daily for 21 days then do not take for 7 days.    Historical Provider, MD  fexofenadine-pseudoephedrine (ALLEGRA-D 24) 180-240 MG per 24 hr tablet Take 1 tablet by mouth daily. 09/22/13   Julie Gobble, MD  fluticasone (FLONASE) 50 MCG/ACT nasal spray  Place 2 sprays into both nostrils daily. 09/12/14   Julie Riddle, PA-C  folic acid (FOLVITE) 1 MG tablet Take 1 mg by mouth daily.    Historical Provider, MD  levothyroxine (SYNTHROID, LEVOTHROID) 112 MCG tablet Take 1 tablet (112 mcg total) by mouth daily before breakfast. 08/16/15   Julie Belling, MD  meloxicam (MOBIC) 15 MG tablet Take 1 tablet (15 mg total) by mouth daily. 11/11/13   Julie Jeffery, PA-C  venlafaxine XR (EFFEXOR-XR) 37.5 MG 24 hr capsule TAKE ONE CAPSULE BY MOUTH DAILY 10/13/15   Julie Gobble, MD   Social History   Social History  . Marital Status: Single    Spouse Name: N/Julie  . Number of Children: N/Julie  . Years of Education: N/Julie   Occupational History  . 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 Smoking status: Current Every Day Smoker -- 1.00 packs/day for 31 years    Types: Cigarettes    Last Attempt to Quit: 08/06/2012  . Smokeless tobacco: Never Used     Comment: trying to quit - on & off for 3 years - down to 1/2 ppd  . Alcohol Use: No  . Drug Use: No  . Sexual Activity: No   Other Topics Concern  . Not on file   Social History Narrative   Divorced. Education: [employer]. Exercise: Elliptical/swim 3 times Julie week for 30 minutes.   Ex-husband addicted to narcotics and is anxious about becoming addicted to any type of medication        Review of Systems  Constitutional: Negative for fever and chills.  Eyes: Negative for pain, redness and itching.  Respiratory: Negative for cough and shortness of breath.   Gastrointestinal: Negative for nausea and vomiting.  Musculoskeletal: Negative for neck pain and neck stiffness.  Neurological: Negative for syncope and speech difficulty.       Objective:   Physical Exam Filed Vitals:   12/08/15 0833  BP: 122/81  Pulse: 69  Temp: 97.9 F (36.6 C)  Resp: 16  Height:  (1.702 m)  Weight: 175 lb (79.379 kg)   CONSTITUTIONAL: Well developed/well nourished HEAD: Normocephalic/atraumatic EYES: EOMI/PERRL ENMT: Mucous membranes moist NECK: supple no meningeal signs SPINE/BACK:entire spine nontender CV: S1/S2 noted, no murmurs/rubs/gallops noted LUNGS: Lungs are clear to auscultation bilaterally, no apparent distress ABDOMEN: soft, nontender, no rebound or guarding, bowel sounds noted throughout abdomen GU:no cva tenderness NEURO: Pt is awake/alert/appropriate, moves Nixon extremitiesx4.  No facial droop.   EXTREMITIES: pulses normal/equal, full ROM SKIN: warm, color normal PSYCH: no abnormalities of mood noted, alert and oriented to situation Pelvic exam and rectal exam was deferred.  EKG  Normal sinus rhythm Results for orders placed or performed in visit on 12/08/15  POCT urinalysis dipstick    Result Value Ref Range   Color, UA yellow yellow   Clarity, UA clear clear   Glucose, UA negative negative   Bilirubin, UA negative negative   Ketones, POC UA negative negative   Spec Grav, UA 1.020    Blood, UA trace-intact (Julie) negative   pH, UA 7.0    Protein Ur, POC negative negative   Urobilinogen, UA 0.2    Nitrite, UA Positive (Julie) Negative   Leukocytes, UA Trace (Julie) Negative  CXR NAD    Assessment & Plan:   labs are good. She did have Julie positive nitrite on her urine so we'll go ahead and culture this. She was again  advised to stop smoking. She is not ready for this at the present time. She was encouraged regarding exercise EKG was done today and this was normal. She will return in the next month and have Julie Prevnar vaccine.I personally performed the services described in this documentation, which was scribed in my presence. The recorded information has been reviewed and is accurate. Also need to be sure and send results of her thyroid studies to Julie. Everardo AllEllison. Earl LitesSteve Kaithlyn Teagle MD

## 2015-12-08 NOTE — Patient Instructions (Signed)
Smoking Cessation, Tips for Success If you are ready to quit smoking, congratulations! You have chosen to help yourself be healthier. Cigarettes bring nicotine, tar, carbon monoxide, and other irritants into your body. Your lungs, heart, and blood vessels will be able to work better without these poisons. There are many different ways to quit smoking. Nicotine gum, nicotine patches, a nicotine inhaler, or nicotine nasal spray can help with physical craving. Hypnosis, support groups, and medicines help break the habit of smoking. WHAT THINGS CAN I DO TO MAKE QUITTING EASIER?  Here are some tips to help you quit for good:  Pick a date when you will quit smoking completely. Tell all of your friends and family about your plan to quit on that date.  Do not try to slowly cut down on the number of cigarettes you are smoking. Pick a quit date and quit smoking completely starting on that day.  Throw away all cigarettes.   Clean and remove all ashtrays from your home, work, and car.  On a card, write down your reasons for quitting. Carry the card with you and read it when you get the urge to smoke.  Cleanse your body of nicotine. Drink enough water and fluids to keep your urine clear or pale yellow. Do this after quitting to flush the nicotine from your body.  Learn to predict your moods. Do not let a bad situation be your excuse to have a cigarette. Some situations in your life might tempt you into wanting a cigarette.  Never have "just one" cigarette. It leads to wanting another and another. Remind yourself of your decision to quit.  Change habits associated with smoking. If you smoked while driving or when feeling stressed, try other activities to replace smoking. Stand up when drinking your coffee. Brush your teeth after eating. Sit in a different chair when you read the paper. Avoid alcohol while trying to quit, and try to drink fewer caffeinated beverages. Alcohol and caffeine may urge you to  smoke.  Avoid foods and drinks that can trigger a desire to smoke, such as sugary or spicy foods and alcohol.  Ask people who smoke not to smoke around you.  Have something planned to do right after eating or having a cup of coffee. For example, plan to take a walk or exercise.  Try a relaxation exercise to calm you down and decrease your stress. Remember, you may be tense and nervous for the first 2 weeks after you quit, but this will pass.  Find new activities to keep your hands busy. Play with a pen, coin, or rubber band. Doodle or draw things on paper.  Brush your teeth right after eating. This will help cut down on the craving for the taste of tobacco after meals. You can also try mouthwash.   Use oral substitutes in place of cigarettes. Try using lemon drops, carrots, cinnamon sticks, or chewing gum. Keep them handy so they are available when you have the urge to smoke.  When you have the urge to smoke, try deep breathing.  Designate your home as a nonsmoking area.  If you are a heavy smoker, ask your health care provider about a prescription for nicotine chewing gum. It can ease your withdrawal from nicotine.  Reward yourself. Set aside the cigarette money you save and buy yourself something nice.  Look for support from others. Join a support group or smoking cessation program. Ask someone at home or at work to help you with your plan   to quit smoking.  Always ask yourself, "Do I need this cigarette or is this just a reflex?" Tell yourself, "Today, I choose not to smoke," or "I do not want to smoke." You are reminding yourself of your decision to quit.  Do not replace cigarette smoking with electronic cigarettes (commonly called e-cigarettes). The safety of e-cigarettes is unknown, and some may contain harmful chemicals.  If you relapse, do not give up! Plan ahead and think about what you will do the next time you get the urge to smoke. HOW WILL I FEEL WHEN I QUIT SMOKING? You  may have symptoms of withdrawal because your body is used to nicotine (the addictive substance in cigarettes). You may crave cigarettes, be irritable, feel very hungry, cough often, get headaches, or have difficulty concentrating. The withdrawal symptoms are only temporary. They are strongest when you first quit but will go away within 10-14 days. When withdrawal symptoms occur, stay in control. Think about your reasons for quitting. Remind yourself that these are signs that your body is healing and getting used to being without cigarettes. Remember that withdrawal symptoms are easier to treat than the major diseases that smoking can cause.  Even after the withdrawal is over, expect periodic urges to smoke. However, these cravings are generally short lived and will go away whether you smoke or not. Do not smoke! WHAT RESOURCES ARE AVAILABLE TO HELP ME QUIT SMOKING? Your health care provider can direct you to community resources or hospitals for support, which may include:  Group support.  Education.  Hypnosis.  Therapy.   This information is not intended to replace advice given to you by your health care provider. Make sure you discuss any questions you have with your health care provider.   Document Released: 09/14/2004 Document Revised: 01/07/2015 Document Reviewed: 06/04/2013 Elsevier Interactive Patient Education 2016 Elsevier Inc.  

## 2015-12-09 LAB — HEPATITIS C ANTIBODY: HCV Ab: NEGATIVE

## 2015-12-10 LAB — URINE CULTURE: Colony Count: >=100000

## 2015-12-12 ENCOUNTER — Other Ambulatory Visit: Payer: Self-pay

## 2015-12-12 MED ORDER — AMOXICILLIN 500 MG PO CAPS: 500.0000 mg | ORAL_CAPSULE | Freq: Two times a day (BID) | ORAL | Status: DC

## 2015-12-28 ENCOUNTER — Other Ambulatory Visit: Payer: Self-pay | Admitting: Family Medicine

## 2015-12-28 ENCOUNTER — Ambulatory Visit (INDEPENDENT_AMBULATORY_CARE_PROVIDER_SITE_OTHER): Payer: BC Managed Care – PPO | Admitting: Family Medicine

## 2015-12-28 VITALS — Temp 98.2°F

## 2015-12-28 DIAGNOSIS — Z23 Encounter for immunization: Secondary | ICD-10-CM

## 2015-12-28 DIAGNOSIS — F17200 Nicotine dependence, unspecified, uncomplicated: Secondary | ICD-10-CM

## 2015-12-28 NOTE — Progress Notes (Signed)
   Subjective:    Patient ID: Julie Nixon, female    DOB: 1961/07/02, 54 y.o.   MRN: [id number]  HPI  Pt presents for Prevnar 13 as ordered by Dr. Earl LitesSteve Daub. Vaccine administered  Review of Systems     Objective:   Physical Exam        Assessment & Plan:

## 2016-01-28 ENCOUNTER — Other Ambulatory Visit: Payer: Self-pay | Admitting: Physician Assistant

## 2016-01-30 ENCOUNTER — Telehealth: Payer: Self-pay

## 2016-01-30 MED ORDER — ATORVASTATIN CALCIUM 10 MG PO TABS: ORAL_TABLET | ORAL | Status: DC

## 2016-01-30 NOTE — Telephone Encounter (Signed)
Pt notified that rx was sent in 

## 2016-01-30 NOTE — Telephone Encounter (Signed)
Patient is completely out of her atorvastatin (LIPITOR) 10 MG tablet she stated the pharmacy sent in a request on the 28th and she has not heard anything. She uses the [facility] at the corner of [address] and [address]

## 2016-03-20 ENCOUNTER — Encounter: Payer: Self-pay | Admitting: Endocrinology

## 2016-03-20 ENCOUNTER — Ambulatory Visit (INDEPENDENT_AMBULATORY_CARE_PROVIDER_SITE_OTHER): Payer: BC Managed Care – PPO | Admitting: Endocrinology

## 2016-03-20 VITALS — BP 128/86 | HR 83 | Temp 98.5°F | Wt 177.0 lb

## 2016-03-20 DIAGNOSIS — E890 Postprocedural hypothyroidism: Secondary | ICD-10-CM | POA: Diagnosis not present

## 2016-03-20 NOTE — Progress Notes (Signed)
Subjective:    Patient ID: Julie Nixon, female    DOB: 07/27/1961, 55 y.o.   MRN: [id number]  HPI Pt returns for f/u of post-I-131 hypothyroidism (in late 2014, pt was dx'ed with hyperthyroidism, due to grave's dz.  She took I-131 rx in January of 2015.  In April of 2015, she was noted to be hypothyroid, and was rx'ed synthroid).  pt reports hot flashes and difficulty with concentration.   Past Medical History  Diagnosis Date  . Anxiety   . Allergy   . Asthma   . Hyperlipidemia   . Thyroid disease     Past Surgical History  Procedure Laterality Date  . Mouth surgery      Social History   Social History  . Marital Status: Single    Spouse Name: N/A  . Number of Children: N/A  . 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 Smoking status: Current Every Day Smoker -- 1.00 packs/day for 31 years    Types: Cigarettes    Last Attempt to Quit: 08/06/2012  . Smokeless tobacco: Never Used     Comment: trying to quit - on & off for 3 years - down to 1/2 ppd  . Alcohol Use: No  . Drug Use: No  . Sexual Activity: Yes   Other Topics Concern  . Not on file   Social History Narrative   Divorced. Education: [employer]. Exercise: Elliptical/swim 3 times a week for 30 minutes.   Ex-husband addicted to narcotics and is anxious about becoming addicted to any type of medication    Current Outpatient Prescriptions on File Prior to Visit  Medication Sig Dispense Refill  . albuterol (PROVENTIL HFA;VENTOLIN HFA) 108 (90 BASE) MCG/ACT inhaler Inhale 2 puffs into the lungs every 6 (six) hours as needed for wheezing. 18 g 2  . atorvastatin (LIPITOR) 10 MG tablet TAKE 1 TABLET BY MOUTH DAILY. 30 tablet 2  . azelastine (ASTELIN) 137 MCG/SPRAY nasal spray Place 2 sprays into the nose 2 (two) times daily. Use in each nostril as directed 30 mL 11  . beclomethasone (QVAR) 80 MCG/ACT inhaler Inhale 1 puff into  the lungs 2 (two) times daily as needed.    Marland Kitchen estrogens, conjugated, (PREMARIN) 0.45 MG tablet Take 0.45 mg by mouth daily. Take daily for 21 days then do not take for 7 days.    . fexofenadine-pseudoephedrine (ALLEGRA-D 24) 180-240 MG per 24 hr tablet Take 1 tablet by mouth daily. 30 tablet 11  . fluticasone (FLONASE) 50 MCG/ACT nasal spray Place 2 sprays into both nostrils daily. 16 g 11  . folic acid (FOLVITE) 1 MG tablet Take 1 mg by mouth daily.    Marland Kitchen levothyroxine (SYNTHROID, LEVOTHROID) 112 MCG tablet Take 1 tablet (112 mcg total) by mouth daily before breakfast. 90 tablet 3  . meloxicam (MOBIC) 15 MG tablet Take 1 tablet (15 mg total) by mouth daily. 30 tablet 1  . venlafaxine XR (EFFEXOR-XR) 37.5 MG 24 hr capsule TAKE ONE CAPSULE BY MOUTH DAILY 30 capsule 22   No current facility-administered medications on file prior to visit.    Allergies  Allergen Reactions  . Egg Yolk     Family History  Problem Relation Age of Onset  . Hypertension Mother   . Heart Problems Mother     CABG  . Hyperlipidemia Mother   . Hypertension Father   . Heart  Problems Father     CABG  . Hyperlipidemia Father   . Hypertension Sister   . Heart Problems Sister   . Hypertension Sister   . Heart Problems Sister   . Hypertension Sister   . Heart Problems Sister   . Heart disease Brother   . Hyperlipidemia Brother   . Hypertension Brother     BP 128/86 mmHg  Pulse 83  Temp(Src) 98.5 F (36.9 C) (Oral)  Wt 177 lb (80.287 kg)  SpO2 93%  LMP 10/27/2013  Review of Systems No weight change    Objective:   Physical Exam VITAL SIGNS:  See vs page GENERAL: no distress NECK: There is no palpable thyroid enlargement.  No thyroid nodule is palpable.  No palpable lymphadenopathy at the anterior neck.    Lab Results  Component Value Date   TSH 2.48 03/20/2016   T4TOTAL 8.5 12/08/2015      Assessment & Plan:  Hypothyroidism: well-replaced.  Patient is advised the following: Patient  Instructions  blood tests are requested for you today.  We'll let you know about the results.   Please return in 1 year, or sooner if necessary.

## 2016-03-20 NOTE — Patient Instructions (Signed)
blood tests are requested for you today.  We'll let you know about the results.   Please return in 1 year, or sooner if necessary.

## 2016-03-21 LAB — TSH: TSH: 2.48 u[IU]/mL (ref 0.35–4.50)

## 2016-07-20 ENCOUNTER — Other Ambulatory Visit: Payer: Self-pay | Admitting: Obstetrics & Gynecology

## 2016-07-20 DIAGNOSIS — Z1231 Encounter for screening mammogram for malignant neoplasm of breast: Secondary | ICD-10-CM

## 2016-07-22 ENCOUNTER — Other Ambulatory Visit: Payer: Self-pay | Admitting: Physician Assistant

## 2016-08-09 ENCOUNTER — Other Ambulatory Visit: Payer: Self-pay | Admitting: Endocrinology

## 2016-08-16 ENCOUNTER — Ambulatory Visit
Admission: RE | Admit: 2016-08-16 | Discharge: 2016-08-16 | Disposition: A | Discharge status: Home / Self Care | Payer: BC Managed Care – PPO | Source: Ambulatory Visit | Attending: Obstetrics & Gynecology | Admitting: Obstetrics & Gynecology

## 2016-08-16 DIAGNOSIS — Z1231 Encounter for screening mammogram for malignant neoplasm of breast: Secondary | ICD-10-CM

## 2016-09-23 ENCOUNTER — Other Ambulatory Visit: Payer: Self-pay | Admitting: Physician Assistant

## 2016-09-24 ENCOUNTER — Telehealth: Payer: Self-pay | Admitting: Family Medicine

## 2016-09-24 NOTE — Telephone Encounter (Signed)
Pt needing a refill on generic Lipitor she has one day worth of medicine please respond

## 2016-09-25 MED ORDER — ATORVASTATIN CALCIUM 10 MG PO TABS: 10.0000 mg | ORAL_TABLET | Freq: Every day | ORAL | 1 refills | Status: DC

## 2016-10-28 ENCOUNTER — Other Ambulatory Visit: Payer: Self-pay | Admitting: Emergency Medicine

## 2016-11-05 ENCOUNTER — Other Ambulatory Visit: Payer: Self-pay | Admitting: Endocrinology

## 2016-11-29 ENCOUNTER — Other Ambulatory Visit: Payer: Self-pay | Admitting: Physician Assistant

## 2016-12-02 NOTE — Telephone Encounter (Signed)
12/2015 last exam needs ov

## 2016-12-12 NOTE — Progress Notes (Signed)
Subjective:    Patient ID: Julie Nixon, female    DOB: Nov 02, 1961, 55 y.o.   MRN: [id number] Chief Complaint  Patient presents with  . Annual Exam  . Medication Refill    LIPITOR,EFFEXOR-XR, and  ASTELIN     HPI Julie Nixon is a 55 yo woman here today for her complete physical exam. This is my first time meeting this patient. She was previously cared for by my retired Animator of Dr. Cleta Alberts who last saw her 1 year prior for the same.  When pt has been laying down at night she feels a jolt in chest - startling - it might wake her up from sleep. Started about 6 wks prior and she has been under increased stress.  No palpitations. Within several minutes of laying down at night she gets lightheaded occasionally.  She wonders if this could be due to her anxiety from her family history.  No other asstd sxs. Does have occ hot flashes from menopause and noting she has been feeling warmer overall.  Primary Preventative Screenings: Cervical Cancer: Follows with gyn Dr. Juliene Pina as did have high grade abnml pap in 2015 - on Premarin 2 times per week. Her appt with her gyn on 1/22 STI screen: Neg Hep C last yr Breast Cancer: Annual mammograms done at the breast center every August. Colorectal Cancer: Had a tubular adenoma polyp and a hyperplastic/lymphoid aggregate polyp on last colonoscopy 02/2014 by Dr. Elnoria Howard. Repeat recommended in 5 years. Cardiac: + FHx of premature CAD - her mother developed known prob at 17 yo but prob started much earlier and ignored and her father with CAD at 88 yo. His brother passed at 69 yo from MI.  She has 9 older siblings - oldest 22 yrs older than her - 55 yo. Both parents had to have CABG. nml EKG 1 yr prior Dec 2016 Weight/blood sugar: Bone Density: Vit D nml at 49 4 yrs prior Immunizations: TDaP 2011, prevnar 2016, hep B 2013 OTC/vit/supp/herbal: Vitamin B complex supplement daily at bedtime. Diet/Exercise: Dentist/Optho:  Chronic Medical  Conditions: Hypothyroidism: Followed by Dr. Everardo All - currently being seen annually. Tobacco use with h/o Asthma and environmental allergies: Pre-contemplative for cessation. Has smoked 1 pack per day for over 3 decades. Failed the patch. Prn albuterol and prn qvar. She did get more patches and gum from [facility]. Has never tried wellbutrin. Chantix caused severe n/v - which made her smoke more.  3 yrs ago she stopped smoking for 14 mos.  HLD: On lipitor 5 mg.  Lipid panel last year was excellent at LDL 77 and non-HDL of 112 Mood d/o: On low dose Effexor XR 37.5mg  qd - she is using it for hot flashes and works very well and evens out mood - she doesn't like it as when she misses a dose she feels funny so some day when menopause is a distant hx and she gets cats again she will come off of it. Fx of CAD: seen at [facility] Dr. Rennis Golden who rec CAC score but insurance refused.  She was advised a stress test would be of low yield so not other w/u or f/u planned.  She is walking for exercise as well as swims in the summer and uses a rotator board core and she feels like it is working well - used it for sev mos now.  Does not use salt at all.   Always has chronic sinus issues.  Has had painful discharge from  right ear which might be due to her headset.  Stays on Allegra-D and uses asteline and flonase prn. 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 Allergy   . Anxiety   . Asthma   . Hyperlipidemia   . Thyroid disease    Past Surgical History:  Procedure Laterality Date  . MOUTH SURGERY     Current Outpatient Prescriptions on File Prior to Visit  Medication Sig Dispense Refill  . albuterol (PROVENTIL HFA;VENTOLIN HFA) 108 (90 BASE) MCG/ACT inhaler Inhale 2 puffs into the lungs every 6 (six) hours as needed for wheezing. 18 g 2  . estrogens, conjugated, (PREMARIN) 0.45 MG tablet Take 0.45  mg by mouth daily. Take daily for 21 days then do not take for 7 days.    . fexofenadine-pseudoephedrine (ALLEGRA-D 24) 180-240 MG per 24 hr tablet Take 1 tablet by mouth daily. 30 tablet 11  . fluticasone (FLONASE) 50 MCG/ACT nasal spray Place 2 sprays into both nostrils daily. 16 g 11  . folic acid (FOLVITE) 1 MG tablet Take 1 mg by mouth daily.     No current facility-administered medications on file prior to visit.    Allergies  Allergen Reactions  . Egg Yolk    Family History  Problem Relation Age of Onset  . Hypertension Mother   . Heart Problems Mother     CABG  . Hyperlipidemia Mother   . Hypertension Father   . Heart Problems Father     CABG  . Hyperlipidemia Father   . Hypertension Sister   . Heart Problems Sister   . Hypertension Sister   . Heart Problems Sister   . Hypertension Sister   . Heart Problems Sister   . Heart disease Brother     massive heart attack  . Heart disease Brother   . Hyperlipidemia Brother   . Hypertension Brother    Social History   Social History  . Marital status: Single    Spouse name: N/A  . Number of children: N/A  . Years of education: N/A   Occupational History  . 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 Smoking status: Current Every Day Smoker    Packs/day: 1.00    Years: 31.00    Types: Cigarettes    Last attempt to quit: 08/06/2012  . Smokeless tobacco: Never Used     Comment: trying to quit - on & off for 3 years - down to 1/2 ppd  . Alcohol use No  . Drug use: No  . Sexual activity: Yes   Other Topics Concern  . None   Social History Narrative   Divorced. Education: [employer]. Exercise: Elliptical/swim 3 times a week for 30 minutes.   Ex-husband addicted to narcotics and is anxious about becoming addicted to any type of medication   Depression screen [facility] 2/9 12/13/2016 12/08/2015 10/12/2014  Decreased Interest 0 0 0  Down, Depressed, Hopeless 0 0 0  PHQ - 2 Score 0 0 0     Review of Systems  All other systems reviewed and are negative.  See HPI    Objective:   Physical Exam  Constitutional: She is oriented to person, place, and time. She appears well-developed and well-nourished. No distress.  HENT:  Head: Normocephalic and atraumatic.  Right Ear: Tympanic membrane, external ear and ear canal normal.  Left Ear: Tympanic membrane, external ear and ear canal normal.  Nose: Nose normal. No mucosal edema or rhinorrhea.  Mouth/Throat: Uvula is midline, oropharynx is clear and moist and mucous membranes are normal. No posterior oropharyngeal erythema.  Eyes: Conjunctivae and EOM are normal. Pupils are equal, round, and reactive to light. Right eye exhibits no discharge. Left eye exhibits no discharge. No scleral icterus.  Neck: Normal range of motion. Neck supple. No thyromegaly present.  Cardiovascular: Normal rate, regular rhythm, normal heart sounds and intact distal pulses.   Pulmonary/Chest: Effort normal and breath sounds normal. No respiratory distress.  Abdominal: Soft. Bowel sounds are normal. There is no tenderness.  Musculoskeletal: She exhibits no edema.  Lymphadenopathy:    She has no cervical adenopathy.  Neurological: She is alert and oriented to person, place, and time. She has normal reflexes.  Skin: Skin is warm and dry. She is not diaphoretic. No erythema.  Psychiatric: She has a normal mood and affect. Her behavior is normal.    Visual Acuity Screening   Right eye Left eye Both eyes  Without correction:     With correction: 20/25 20/25 20/20      BP 132/84 (BP Location: Left Arm, Patient Position: Sitting, Cuff Size: Normal)   Pulse 72   Temp 98.2 F (36.8 C) (Oral)   Resp 16   Ht 5' 6.5" (1.689 m)   Wt 170 lb 3.2 oz (77.2 kg)   LMP 10/27/2013   SpO2 98%   BMI 27.06 kg/m     [facility] reading (PRIMARY) by  Dr. Clelia CroftShaw. Sinus bradycardia. No acute ischemic changes  Assessment & Plan:   Pt refuses Flu shot  1. Annual  physical exam - well woman care with gyn Dr. Juliene PinaMody  2. Routine screening for STI (sexually transmitted infection)   3. Screening for cardiovascular, respiratory, and genitourinary diseases   4. Screening for deficiency anemia   5. Hyperlipidemia, unspecified hyperlipidemia type - Is taking lipitor 5mg . Lipids well controlled and pt has no statin side effects, will stay on - poss more helpful for the anti-inflammatory effect rather than just lipid lowering  6. Other specified hypothyroidism - tsh at goal, cont levothyroxine 112.  Sees Dr. Everardo AllEllison  7. Smoker - encouraged cessation but pt pre-contemplative.  Advised CT for lung cancer screening a- s soon as she ages in after 55 yo. Pt agrees  8. Family history of coronary artery disease occurring prior to 55 years of age - pt with early heart disease in mother, father, and brother. Pt was [facility] who rec coronary artery calcium score as stress test was likely be to very low yeild but ins wouldn't cover so she was rec to cont to aggressively control risk factors. However, pt has no interest in smoking cessation which is by far her biggest risk. Will check hs-crp and apoB to try to get some idea of her current genetic risk. Start asa 81.   9. Mild intermittent chronic asthma without complication - refilled prn albuterol, uses rarely  10. Chronic back pain, unspecified back location, unspecified back pain laterality - prn mobic    Orders Placed This Encounter  Procedures  . CBC  . Comprehensive metabolic panel    Order Specific Question:   Has the patient fasted?    Answer:   Yes  . TSH  . Lipid panel    Order Specific Question:   Has the patient fasted?    Answer:   Yes  .  High sensitivity CRP  . Apolipoprotein B  . POCT urinalysis dipstick  . EKG 12-Lead    Meds ordered this encounter  Medications  . atorvastatin (LIPITOR) 10 MG tablet    Sig: Take 0.5 tablets (5 mg total) by mouth daily.    Dispense:  90  tablet    Refill:  3  . venlafaxine XR (EFFEXOR-XR) 37.5 MG 24 hr capsule    Sig: Take 1 capsule (37.5 mg total) by mouth daily.    Dispense:  90 capsule    Refill:  3  . Albuterol Sulfate (PROAIR RESPICLICK) 108 (90 Base) MCG/ACT AEPB    Sig: Inhale 2 puffs into the lungs every 4 (four) hours as needed.    Dispense:  1 each    Refill:  0  . meloxicam (MOBIC) 15 MG tablet    Sig: Take 1 tablet (15 mg total) by mouth daily as needed for pain.    Dispense:  30 tablet    Refill:  1  . azelastine (ASTELIN) 0.1 % nasal spray    Sig: Place 1 spray into both nostrils 2 (two) times daily. Use in each nostril as directed    Dispense:  30 mL    Refill:  11     Norberto SorensonEva Shaw, M.D.  [facility]  [place] [address] [place], [place] [address] [phone] phone [phone] fax  12/15/16 10:52 AM

## 2016-12-13 ENCOUNTER — Encounter: Payer: Self-pay | Admitting: Family Medicine

## 2016-12-13 ENCOUNTER — Ambulatory Visit (INDEPENDENT_AMBULATORY_CARE_PROVIDER_SITE_OTHER): Payer: BC Managed Care – PPO | Admitting: Family Medicine

## 2016-12-13 VITALS — BP 132/84 | HR 72 | Temp 98.2°F | Resp 16 | Ht 66.5 in | Wt 170.2 lb

## 2016-12-13 DIAGNOSIS — Z0000 Encounter for general adult medical examination without abnormal findings: Secondary | ICD-10-CM | POA: Diagnosis not present

## 2016-12-13 DIAGNOSIS — J4520 Mild intermittent asthma, uncomplicated: Secondary | ICD-10-CM

## 2016-12-13 DIAGNOSIS — Z136 Encounter for screening for cardiovascular disorders: Secondary | ICD-10-CM

## 2016-12-13 DIAGNOSIS — Z8249 Family history of ischemic heart disease and other diseases of the circulatory system: Secondary | ICD-10-CM

## 2016-12-13 DIAGNOSIS — Z1389 Encounter for screening for other disorder: Secondary | ICD-10-CM | POA: Diagnosis not present

## 2016-12-13 DIAGNOSIS — M549 Dorsalgia, unspecified: Secondary | ICD-10-CM

## 2016-12-13 DIAGNOSIS — E038 Other specified hypothyroidism: Secondary | ICD-10-CM | POA: Diagnosis not present

## 2016-12-13 DIAGNOSIS — Z113 Encounter for screening for infections with a predominantly sexual mode of transmission: Secondary | ICD-10-CM | POA: Diagnosis not present

## 2016-12-13 DIAGNOSIS — E785 Hyperlipidemia, unspecified: Secondary | ICD-10-CM

## 2016-12-13 DIAGNOSIS — G8929 Other chronic pain: Secondary | ICD-10-CM

## 2016-12-13 DIAGNOSIS — F17200 Nicotine dependence, unspecified, uncomplicated: Secondary | ICD-10-CM | POA: Diagnosis not present

## 2016-12-13 DIAGNOSIS — Z1383 Encounter for screening for respiratory disorder NEC: Secondary | ICD-10-CM

## 2016-12-13 DIAGNOSIS — J3089 Other allergic rhinitis: Secondary | ICD-10-CM | POA: Diagnosis not present

## 2016-12-13 DIAGNOSIS — Z130 Encounter for screening for diseases of the blood and blood-forming organs and certain disorders involving the immune mechanism: Secondary | ICD-10-CM

## 2016-12-13 LAB — POCT URINALYSIS DIP (MANUAL ENTRY)
BILIRUBIN UA: NEGATIVE
BILIRUBIN UA: NEGATIVE
Glucose, UA: NEGATIVE
Leukocytes, UA: NEGATIVE
NITRITE UA: NEGATIVE
PH UA: 6
Protein Ur, POC: NEGATIVE
RBC UA: NEGATIVE
Spec Grav, UA: 1.02
Urobilinogen, UA: 0.2

## 2016-12-13 MED ORDER — VENLAFAXINE HCL ER 37.5 MG PO CP24: 37.5000 mg | ORAL_CAPSULE | Freq: Every day | ORAL | 3 refills | Status: DC

## 2016-12-13 MED ORDER — ALBUTEROL SULFATE 108 (90 BASE) MCG/ACT IN AEPB: 2.0000 | INHALATION_SPRAY | RESPIRATORY_TRACT | 0 refills | Status: DC | PRN

## 2016-12-13 MED ORDER — ATORVASTATIN CALCIUM 10 MG PO TABS: 5.0000 mg | ORAL_TABLET | Freq: Every day | ORAL | 3 refills | Status: DC

## 2016-12-13 MED ORDER — MELOXICAM 15 MG PO TABS: 15.0000 mg | ORAL_TABLET | Freq: Every day | ORAL | 1 refills | Status: DC | PRN

## 2016-12-13 NOTE — Patient Instructions (Addendum)
Drop by our 102 office to ask for a pro-air coupon for 0.1 off the pharmaceutical company website online so you can have your co-pay reduced to $15. These expire before the end of the year.    IF you received an x-ray today, you will receive an invoice from [facility]. Please contact [facility] at [phone] with questions or concerns regarding your invoice.   IF you received labwork today, you will receive an invoice from [facility]. Please contact [facility] at [phone] with questions or concerns regarding your invoice.   Our billing staff will not be able to assist you with questions regarding bills from these companies.  You will be contacted with the lab results as soon as they are available. The fastest way to get your results is to activate your My Chart account. Instructions are located on the last page of this paperwork. If you have not heard from us regarding the results in 2 weeks, please contact this office.     Smoking Hazards Smoking cigarettes is extremely bad for your health. Tobacco smoke has over 200 known poisons in it. It contains the poisonous gases nitrogen oxide and carbon monoxide. There are over 60 chemicals in tobacco smoke that cause cancer. Some of the chemicals found in cigarette smoke include:   Cyanide.   Benzene.   Formaldehyde.   Methanol (wood alcohol).   Acetylene (fuel used in welding torches).   Ammonia.  Even smoking lightly shortens your life expectancy by several years. You can greatly reduce the risk of medical problems for you and your family by stopping now. Smoking is the most preventable cause of death and disease in our society. Within days of quitting smoking, your circulation improves, you decrease the risk of having a heart attack, and your lung capacity improves. There may be some increased phlegm in the first few days after quitting, and it may take months for your lungs to clear  up completely. Quitting for 10 years reduces your risk of developing lung cancer to almost that of a nonsmoker.  WHAT ARE THE RISKS OF SMOKING? Cigarette smokers have an increased risk of many serious medical problems, including:  Lung cancer.   Lung disease (such as pneumonia, bronchitis, and emphysema).   Heart attack and chest pain due to the heart not getting enough oxygen (angina).   Heart disease and peripheral blood vessel disease.   Hypertension.   Stroke.   Oral cancer (cancer of the lip, mouth, or voice box).   Bladder cancer.   Pancreatic cancer.   Cervical cancer.   Pregnancy complications, including premature birth.   Stillbirths and smaller newborn babies, birth defects, and genetic damage to sperm.   Early menopause.   Lower estrogen level for women.   Infertility.   Facial wrinkles.   Blindness.   Increased risk of broken bones (fractures).   Senile dementia.   Stomach ulcers and internal bleeding.   Delayed wound healing and increased risk of complications during surgery. Because of secondhand smoke exposure, children of smokers have an increased risk of the following:   Sudden infant death syndrome (SIDS).   Respiratory infections.   Lung cancer.   Heart disease.   Ear infections.  WHY IS SMOKING ADDICTIVE? Nicotine is the chemical agent in tobacco that is capable of causing addiction or dependence. When you smoke and inhale, nicotine is absorbed rapidly into the bloodstream through your lungs. Both inhaled and noninhaled nicotine may be addictive.  WHAT ARE THE BENEFITS  OF QUITTING?  There are many health benefits to quitting smoking. Some are:   The likelihood of developing cancer and heart disease decreases. Health improvements are seen almost immediately.   Blood pressure, pulse rate, and breathing patterns start returning to normal soon after quitting.   People who quit may see an improvement in their  overall quality of life.  HOW DO YOU QUIT SMOKING? Smoking is an addiction with both physical and psychological effects, and longtime habits can be hard to change. Your health care provider can recommend:  Programs and community resources, which may include group support, education, or therapy.  Replacement products, such as patches, gum, and nasal sprays. Use these products only as directed. Do not replace cigarette smoking with electronic cigarettes (commonly called e-cigarettes). The safety of e-cigarettes is unknown, and some may contain harmful chemicals. FOR MORE INFORMATION  American Lung Association: www.lung.org  American Cancer Society: www.cancer.org This information is not intended to replace advice given to you by your health care provider. Make sure you discuss any questions you have with your health care provider. Document Released: 01/24/2005 Document Revised: 04/09/2016 Document Reviewed: 06/08/2013 Elsevier Interactive Patient Education  2017 Elsevier Inc. Coronary Artery Disease, Female Coronary artery disease (CAD) is a process in which the blood vessels of the heart (coronary arteries) become narrow or blocked. The narrowing or blockage can lead to decreased blood flow to the heart muscle (angina). Symptoms known as angina can develop if the blood flow is reduced to the heart for a short period of time. Prolonged reduced blood flow can cause a heart attack (myocardial infarction, MI). CAD is a leading cause of death for women. More women die from CAD than from cancer, lung disease, and accidents combined. It is important for women to understand the risks, symptoms, and treatment options for CAD. What are the causes? Atherosclerosis is the cause of CAD. Atherosclerosis is the buildup of fat and cholesterol (plaque) on the inside of the arteries. Over time, the plaque may narrow or block the artery, and this will lessen blood flow to the heart. Plaque can also become weak and  break off within a coronary artery to form a clot and cause a sudden blockage. What increases the risk? Many risk factors increase your chances of getting CAD, including:  High cholesterol levels.  High blood pressure (hypertension).  Tobacco use.  Diabetes.  Age. Women over age 53 are at a greater risk of CAD.  Menopause.  All postmenopausal women are at greater risk of CAD.  Women who have experienced menopause between the ages of 940-45 (early menopause) are at a higher risk of CAD.  Women who have experienced menopause before age 53 (premature menopause) are at an extremely high risk of CAD.  Family history of CAD.  Obesity.  Lack of exercise.  A diet high in saturated fats. What are the signs or symptoms? Many people do not experience any symptoms during the early stages of CAD. As the condition progresses, symptoms may include:  Chest pain.  The pain can be described as crushing or squeezing, or a tightness, pressure, fullness, or heaviness in the chest.  The pain can last more than a few minutes or can stop and recur.  Pain in the arms, neck, jaw, or back.  Unexplained heartburn or indigestion.  Shortness of breath.  Nausea.  Sudden cold sweats.  Sudden light-headedness. Many women have chest discomfort and the other symptoms. However, women often have different (atypical) symptoms, such as:  Fatigue.  Unexplained feelings of nervousness or anxiety.  Unexplained weakness.  Dizziness or fainting. Sometimes, women may not have any symptoms of CAD. How is this diagnosed? Tests to diagnose CAD may include:  ECG (electrocardiogram).  Exercise stress test. This looks for signs of blockage when the heart is being exercised.  Pharmacologic stress test. This test looks for signs of blockage when the heart is being stressed with a medicine.  Blood tests.  Coronary angiogram. This is a procedure to look at the coronary arteries to see if there is any  blockage. How is this treated? The treatment of CAD may include the following:  Healthy behavioral changes to reduce or control risk factors.  Medicine.  Coronary stenting.A stent helps to keep an artery open.  Coronary angioplasty. This procedure widens a narrowed or blocked artery.  Coronary arterybypass surgery. This will allow your blood to pass the blockage (bypass) to reach your heart. Follow these instructions at home:  Take medicines only as directed by your health care provider.  Do not take the following medicines unless your health care provider approves:  Nonsteroidal anti-inflammatory drugs (NSAIDs), such as ibuprofen, naproxen, or celecoxib.  Vitamin supplements that contain vitamin A, vitamin E, or both.  Hormone replacement therapy that contains estrogen with or without progestin.  Manage other health conditions such as hypertension and diabetes as directed by your health care provider.  Follow a heart-healthy diet. A dietitian can help to educate you about healthy food options and changes.  Use healthy cooking methods such as roasting, grilling, broiling, baking, poaching, steaming, or stir-frying. Talk to a dietitian to learn more about healthy cooking methods.  Follow an exercise program approved by your health care provider.  Maintain a healthy weight. Lose weight as approved by your health care provider.  Plan rest periods when fatigued.  Learn to manage stress.  Do not use any tobacco products, including cigarettes, chewing tobacco, or electronic cigarettes. If you need help quitting, ask your health care provider.  If you drink alcohol, and your health care provider approves, limit your alcohol intake to no more than 1 drink per day. One drink equals 12 ounces of beer, 5 ounces of wine, or 1 ounces of hard liquor.  Stop illegal drug use.  Your health care provider may ask you to monitor your blood pressure. A blood pressure reading consists of a  higher number over a lower number, such as 110 over 72, which is written as 110/72. Ideally, your blood pressure should be:  Below 140/90 if you have no other medical conditions.  Below 130/80 if you have diabetes or kidney disease.  Keep all follow-up visits as directed by your health care provider. This is important. Get help right away if:  You have pain in your chest, neck, arm, jaw, stomach, or back that lasts more than a few minutes, is recurring, or is unrelieved by taking medicine under your tongue (sublingualnitroglycerin).  You have profuse sweating without cause.  You have unexplained:  Heartburn or indigestion.  Shortness of breath or difficulty breathing.  Nausea or vomiting.  Fatigue.  Feelings of nervousness or anxiety.  Weakness.  Diarrhea.  You have sudden light-headedness or dizziness.  You faint. These symptoms may represent a serious problem that is an emergency. Do not wait to see if the symptoms will go away. Get medical help right away. Call your local emergency services (911 in the [place] Do not drive yourself to the hospital.  This information is not intended to replace  advice given to you by your health care provider. Make sure you discuss any questions you have with your health care provider. Document Released: 03/10/2012 Document Revised: 05/24/2016 Document Reviewed: 04/20/2014 Elsevier Interactive Patient Education  2017 [employer].

## 2016-12-14 LAB — TSH: TSH: 2.24 u[IU]/mL (ref 0.450–4.500)

## 2016-12-14 LAB — COMPREHENSIVE METABOLIC PANEL
A/G RATIO: 1.9 (ref 1.2–2.2)
ALT: 18 IU/L (ref 0–32)
AST: 12 IU/L (ref 0–40)
Albumin: 4.6 g/dL (ref 3.5–5.5)
Alkaline Phosphatase: 59 IU/L (ref 39–117)
BILIRUBIN TOTAL: 0.4 mg/dL (ref 0.0–1.2)
BUN/Creatinine Ratio: 15 (ref 9–23)
BUN: 10 mg/dL (ref 6–24)
CALCIUM: 9.4 mg/dL (ref 8.7–10.2)
CHLORIDE: 100 mmol/L (ref 96–106)
CO2: 27 mmol/L (ref 18–29)
Creatinine, Ser: 0.66 mg/dL (ref 0.57–1.00)
GFR calc Af Amer: 117 mL/min/{1.73_m2} (ref >59)
GFR calc non Af Amer: 101 mL/min/{1.73_m2} (ref >59)
Globulin, Total: 2.4 g/dL (ref 1.5–4.5)
Glucose: 99 mg/dL (ref 65–99)
POTASSIUM: 4.3 mmol/L (ref 3.5–5.2)
Sodium: 141 mmol/L (ref 134–144)
Total Protein: 7 g/dL (ref 6.0–8.5)

## 2016-12-14 LAB — CBC
Hematocrit: 44 % (ref 34.0–46.6)
Hemoglobin: 15 g/dL (ref 11.1–15.9)
MCH: 31.6 pg (ref 26.6–33.0)
MCHC: 34.1 g/dL (ref 31.5–35.7)
MCV: 93 fL (ref 79–97)
PLATELETS: 283 10*3/uL (ref 150–379)
RBC: 4.75 x10E6/uL (ref 3.77–5.28)
RDW: 12.4 % (ref 12.3–15.4)
WBC: 6.6 10*3/uL (ref 3.4–10.8)

## 2016-12-14 LAB — APOLIPOPROTEIN B: Apolipoprotein B: 96 mg/dL (ref 54–133)

## 2016-12-14 LAB — LIPID PANEL
CHOLESTEROL TOTAL: 198 mg/dL (ref 100–199)
Chol/HDL Ratio: 3.6 ratio units (ref 0.0–4.4)
HDL: 55 mg/dL (ref >39)
LDL Calculated: 119 mg/dL — ABNORMAL HIGH (ref 0–99)
TRIGLYCERIDES: 119 mg/dL (ref 0–149)
VLDL Cholesterol Cal: 24 mg/dL (ref 5–40)

## 2016-12-14 LAB — HIGH SENSITIVITY CRP: CRP HIGH SENSITIVITY: 0.67 mg/L (ref 0.00–3.00)

## 2016-12-15 MED ORDER — AZELASTINE HCL 0.1 % NA SOLN: 1.0000 | Freq: Two times a day (BID) | NASAL | 11 refills | Status: DC

## 2016-12-16 MED ORDER — LEVOTHYROXINE SODIUM 112 MCG PO TABS: ORAL_TABLET | ORAL | 1 refills | Status: DC

## 2017-02-22 ENCOUNTER — Encounter: Payer: Self-pay | Admitting: Family Medicine

## 2017-02-26 MED ORDER — HYDROCORTISONE-ACETIC ACID 1-2 % OT SOLN: 3.0000 [drp] | Freq: Three times a day (TID) | OTIC | 0 refills | Status: DC

## 2017-02-26 NOTE — Addendum Note (Signed)
Addended by: Norberto SorensonSHAW, Mouhamed Glassco on: 02/26/2017 02:24 PM   Modules accepted: Orders, Level of Service

## 2017-03-20 ENCOUNTER — Encounter: Payer: Self-pay | Admitting: Endocrinology

## 2017-03-20 ENCOUNTER — Ambulatory Visit (INDEPENDENT_AMBULATORY_CARE_PROVIDER_SITE_OTHER): Payer: BC Managed Care – PPO | Admitting: Endocrinology

## 2017-03-20 VITALS — BP 122/76 | HR 78 | Ht 66.5 in | Wt 179.0 lb

## 2017-03-20 DIAGNOSIS — E890 Postprocedural hypothyroidism: Secondary | ICD-10-CM | POA: Diagnosis not present

## 2017-03-20 NOTE — Progress Notes (Signed)
Subjective:    Patient ID: Julie Nixon, female    DOB: 08/07/1961, 56 y.o.   MRN: [id number]  HPI Pt returns for f/u of post-RAI hypothyroidism (in late 2014, pt was dx'ed with hyperthyroidism, due to grave's dz.  She took RAI in January of 2015.  She was rx'ed synthroid soon thereafter).  pt states she feels well in general, except for weight gain.  Past Medical History:  Diagnosis Date  . Allergy   . Anxiety   . Asthma   . Hyperlipidemia   . Thyroid disease     Past Surgical History:  Procedure Laterality Date  . MOUTH SURGERY      Social History   Social History  . Marital status: Single    Spouse name: N/A  . Number of children: N/A  . Years of education: N/A   Occupational History  . 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 Smoking status: Current Every Day Smoker    Packs/day: 1.00    Years: 31.00    Types: Cigarettes    Last attempt to quit: 08/06/2012  . Smokeless tobacco: Never Used     Comment: trying to quit - on & off for 3 years - down to 1/2 ppd  . Alcohol use No  . Drug use: No  . Sexual activity: Yes   Other Topics Concern  . Not on file   Social History Narrative   Divorced. Education: [employer]. Exercise: Elliptical/swim 3 times a week for 30 minutes.   Ex-husband addicted to narcotics and is anxious about becoming addicted to any type of medication    Current Outpatient Prescriptions on File Prior to Visit  Medication Sig Dispense Refill  . acetic acid-hydrocortisone (VOSOL-HC) otic solution Place 3 drops into the right ear 3 (three) times daily. 10 mL 0  . albuterol (PROVENTIL HFA;VENTOLIN HFA) 108 (90 BASE) MCG/ACT inhaler Inhale 2 puffs into the lungs every 6 (six) hours as needed for wheezing. 18 g 2  . Albuterol Sulfate (PROAIR RESPICLICK) 108 (90 Base) MCG/ACT AEPB Inhale 2 puffs into the lungs every 4 (four) hours as needed. 1 each 0  . atorvastatin (LIPITOR) 10 MG tablet Take 0.5 tablets (5 mg  total) by mouth daily. 90 tablet 3  . azelastine (ASTELIN) 0.1 % nasal spray Place 1 spray into both nostrils 2 (two) times daily. Use in each nostril as directed 30 mL 11  . estrogens, conjugated, (PREMARIN) 0.45 MG tablet Take 0.45 mg by mouth daily. Take daily for 21 days then do not take for 7 days.    . fexofenadine-pseudoephedrine (ALLEGRA-D 24) 180-240 MG per 24 hr tablet Take 1 tablet by mouth daily. 30 tablet 11  . fluticasone (FLONASE) 50 MCG/ACT nasal spray Place 2 sprays into both nostrils daily. 16 g 11  . folic acid (FOLVITE) 1 MG tablet Take 1 mg by mouth daily.    Marland Kitchen levothyroxine (SYNTHROID, LEVOTHROID) 112 MCG tablet TAKE 1 TABLET(112 MCG) BY MOUTH DAILY BEFORE BREAKFAST 90 tablet 1  . meloxicam (MOBIC) 15 MG tablet Take 1 tablet (15 mg total) by mouth daily as needed for pain. 30 tablet 1  . venlafaxine XR (EFFEXOR-XR) 37.5 MG 24 hr capsule Take 1 capsule (37.5 mg total) by mouth daily. 90 capsule 3   No current facility-administered medications on file prior to visit.     Allergies  Allergen Reactions  . Egg Yolk     Family History  Problem  Relation Age of Onset  . Hypertension Mother   . Heart Problems Mother     CABG  . Hyperlipidemia Mother   . Hypertension Father   . Heart Problems Father     CABG  . Hyperlipidemia Father   . Hypertension Sister   . Heart Problems Sister   . Hypertension Sister   . Heart Problems Sister   . Hypertension Sister   . Heart Problems Sister   . Heart disease Brother     massive heart attack  . Heart disease Brother   . Hyperlipidemia Brother   . Hypertension Brother     BP 122/76   Pulse 78   Ht 5' 6.5" (1.689 m)   Wt 179 lb (81.2 kg)   LMP 10/27/2013   SpO2 95%   BMI 28.46 kg/m   Review of Systems Denies leg edema.     Objective:   Physical Exam VITAL SIGNS:  See vs page.  GENERAL: no distress. NECK: There is no palpable thyroid enlargement.  No thyroid nodule is palpable.  No palpable lymphadenopathy at the  anterior neck.       Assessment & Plan:  post-RAI hypothyroidism: due for recheck Weight gain: prob not thyroid-related.  Patient is advised the following: Patient Instructions  Thyroid blood tests are requested for you today.  We'll let you know about the results.   Please return in 1 year, or sooner if necessary.

## 2017-03-20 NOTE — Patient Instructions (Addendum)
Thyroid blood tests are requested for you today.  We'll let you know about the results.   Please return in 1 year, or sooner if necessary.

## 2017-03-21 LAB — TSH: TSH: 4.15 u[IU]/mL (ref 0.35–4.50)

## 2017-03-21 LAB — T4, FREE: Free T4: 1.11 ng/dL (ref 0.60–1.60)

## 2017-05-04 ENCOUNTER — Encounter: Payer: Self-pay | Admitting: Family Medicine

## 2017-05-04 ENCOUNTER — Ambulatory Visit (INDEPENDENT_AMBULATORY_CARE_PROVIDER_SITE_OTHER): Payer: BC Managed Care – PPO | Admitting: Family Medicine

## 2017-05-04 VITALS — BP 124/78 | HR 93 | Temp 98.6°F | Resp 16 | Ht 67.0 in | Wt 173.0 lb

## 2017-05-04 DIAGNOSIS — F17200 Nicotine dependence, unspecified, uncomplicated: Secondary | ICD-10-CM

## 2017-05-04 DIAGNOSIS — J301 Allergic rhinitis due to pollen: Secondary | ICD-10-CM

## 2017-05-04 DIAGNOSIS — J0100 Acute maxillary sinusitis, unspecified: Secondary | ICD-10-CM | POA: Diagnosis not present

## 2017-05-04 MED ORDER — AMOXICILLIN-POT CLAVULANATE 875-125 MG PO TABS: 1.0000 | ORAL_TABLET | Freq: Two times a day (BID) | ORAL | 0 refills | Status: DC

## 2017-05-04 NOTE — Patient Instructions (Addendum)
   IF you received an x-ray today, you will receive an invoice from [facility]. Please contact [facility] at [phone] with questions or concerns regarding your invoice.   IF you received labwork today, you will receive an invoice from [facility]. Please contact [facility] at [phone] with questions or concerns regarding your invoice.   Our billing staff will not be able to assist you with questions regarding bills from these companies.  You will be contacted with the lab results as soon as they are available. The fastest way to get your results is to activate your My Chart account. Instructions are located on the last page of this paperwork. If you have not heard from us regarding the results in 2 weeks, please contact this office.     Allergic Rhinitis Allergic rhinitis is when the mucous membranes in the nose respond to allergens. Allergens are particles in the air that cause your body to have an allergic reaction. This causes you to release allergic antibodies. Through a chain of events, these eventually cause you to release histamine into the blood stream. Although meant to protect the body, it is this release of histamine that causes your discomfort, such as frequent sneezing, congestion, and an itchy, runny nose. What are the causes? Seasonal allergic rhinitis (hay fever) is caused by pollen allergens that may come from grasses, trees, and weeds. Year-round allergic rhinitis (perennial allergic rhinitis) is caused by allergens such as house dust mites, pet dander, and mold spores. What are the signs or symptoms?  Nasal stuffiness (congestion).  Itchy, runny nose with sneezing and tearing of the eyes. How is this diagnosed? Your health care provider can help you determine the allergen or allergens that trigger your symptoms. If you and your health care provider are unable to determine the allergen, skin or blood testing may be used. Your health care provider will  diagnose your condition after taking your health history and performing a physical exam. Your health care provider may assess you for other related conditions, such as asthma, pink eye, or an ear infection. How is this treated? Allergic rhinitis does not have a cure, but it can be controlled by:  Medicines that block allergy symptoms. These may include allergy shots, nasal sprays, and oral antihistamines.  Avoiding the allergen. Hay fever may often be treated with antihistamines in pill or nasal spray forms. Antihistamines block the effects of histamine. There are over-the-counter medicines that may help with nasal congestion and swelling around the eyes. Check with your health care provider before taking or giving this medicine. If avoiding the allergen or the medicine prescribed do not work, there are many new medicines your health care provider can prescribe. Stronger medicine may be used if initial measures are ineffective. Desensitizing injections can be used if medicine and avoidance does not work. Desensitization is when a patient is given ongoing shots until the body becomes less sensitive to the allergen. Make sure you follow up with your health care provider if problems continue. Follow these instructions at home: It is not possible to completely avoid allergens, but you can reduce your symptoms by taking steps to limit your exposure to them. It helps to know exactly what you are allergic to so that you can avoid your specific triggers. Contact a health care provider if:  You have a fever.  You develop a cough that does not stop easily (persistent).  You have shortness of breath.  You start wheezing.  Symptoms interfere with normal daily activities.   This information is not intended to replace advice given to you by your health care provider. Make sure you discuss any questions you have with your health care provider. Document Released: 09/11/2001 Document Revised: 08/17/2016 Document  Reviewed: 08/24/2013 Elsevier Interactive Patient Education  2017 Elsevier Inc.  
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
## 2017-08-01 ENCOUNTER — Other Ambulatory Visit: Payer: Self-pay | Admitting: Family Medicine

## 2017-08-02 ENCOUNTER — Other Ambulatory Visit: Payer: Self-pay | Admitting: Family Medicine

## 2017-08-02 ENCOUNTER — Other Ambulatory Visit: Payer: Self-pay | Admitting: Obstetrics & Gynecology

## 2017-08-02 DIAGNOSIS — Z1231 Encounter for screening mammogram for malignant neoplasm of breast: Secondary | ICD-10-CM

## 2017-08-22 ENCOUNTER — Ambulatory Visit
Admission: RE | Admit: 2017-08-22 | Discharge: 2017-08-22 | Disposition: A | Discharge status: Home / Self Care | Payer: BC Managed Care – PPO | Source: Ambulatory Visit | Attending: Family Medicine | Admitting: Family Medicine

## 2017-08-22 DIAGNOSIS — Z1231 Encounter for screening mammogram for malignant neoplasm of breast: Secondary | ICD-10-CM

## 2017-08-26 ENCOUNTER — Other Ambulatory Visit: Payer: Self-pay | Admitting: Emergency Medicine

## 2017-08-26 DIAGNOSIS — R928 Other abnormal and inconclusive findings on diagnostic imaging of breast: Secondary | ICD-10-CM

## 2017-08-28 ENCOUNTER — Ambulatory Visit
Admission: RE | Admit: 2017-08-28 | Discharge: 2017-08-28 | Disposition: A | Discharge status: Home / Self Care | Payer: BC Managed Care – PPO | Source: Ambulatory Visit | Attending: Emergency Medicine | Admitting: Emergency Medicine

## 2017-08-28 DIAGNOSIS — R928 Other abnormal and inconclusive findings on diagnostic imaging of breast: Secondary | ICD-10-CM

## 2017-10-27 ENCOUNTER — Other Ambulatory Visit: Payer: Self-pay | Admitting: Family Medicine

## 2017-12-16 ENCOUNTER — Ambulatory Visit (INDEPENDENT_AMBULATORY_CARE_PROVIDER_SITE_OTHER): Payer: BC Managed Care – PPO | Admitting: Family Medicine

## 2017-12-16 VITALS — BP 120/78 | HR 72 | Temp 98.2°F | Resp 16 | Ht 67.0 in | Wt 167.0 lb

## 2017-12-16 DIAGNOSIS — Z0000 Encounter for general adult medical examination without abnormal findings: Secondary | ICD-10-CM

## 2017-12-16 DIAGNOSIS — J3089 Other allergic rhinitis: Secondary | ICD-10-CM

## 2017-12-16 DIAGNOSIS — M549 Dorsalgia, unspecified: Secondary | ICD-10-CM

## 2017-12-16 DIAGNOSIS — E890 Postprocedural hypothyroidism: Secondary | ICD-10-CM | POA: Diagnosis not present

## 2017-12-16 DIAGNOSIS — F17200 Nicotine dependence, unspecified, uncomplicated: Secondary | ICD-10-CM | POA: Diagnosis not present

## 2017-12-16 DIAGNOSIS — E782 Mixed hyperlipidemia: Secondary | ICD-10-CM

## 2017-12-16 DIAGNOSIS — R062 Wheezing: Secondary | ICD-10-CM | POA: Diagnosis not present

## 2017-12-16 DIAGNOSIS — G8929 Other chronic pain: Secondary | ICD-10-CM

## 2017-12-16 LAB — POCT URINALYSIS DIP (MANUAL ENTRY)
Bilirubin, UA: NEGATIVE
GLUCOSE UA: NEGATIVE mg/dL
Ketones, POC UA: NEGATIVE mg/dL
Leukocytes, UA: NEGATIVE
Nitrite, UA: NEGATIVE
Protein Ur, POC: NEGATIVE mg/dL
RBC UA: NEGATIVE
SPEC GRAV UA: 1.025 (ref 1.010–1.025)
UROBILINOGEN UA: 0.2 U/dL
pH, UA: 5.5 (ref 5.0–8.0)

## 2017-12-16 MED ORDER — HYDROCORTISONE-ACETIC ACID 1-2 % OT SOLN: 3.0000 [drp] | Freq: Three times a day (TID) | OTIC | 0 refills | Status: DC

## 2017-12-16 MED ORDER — VENLAFAXINE HCL ER 37.5 MG PO CP24: 37.5000 mg | ORAL_CAPSULE | Freq: Every day | ORAL | 3 refills | Status: DC

## 2017-12-16 MED ORDER — MELOXICAM 15 MG PO TABS: 15.0000 mg | ORAL_TABLET | Freq: Every day | ORAL | 1 refills | Status: AC | PRN

## 2017-12-16 MED ORDER — ALBUTEROL SULFATE 108 (90 BASE) MCG/ACT IN AEPB: 2.0000 | INHALATION_SPRAY | RESPIRATORY_TRACT | 0 refills | Status: AC | PRN

## 2017-12-16 MED ORDER — ALBUTEROL SULFATE (2.5 MG/3ML) 0.083% IN NEBU
2.5000 mg | INHALATION_SOLUTION | Freq: Once | RESPIRATORY_TRACT | Status: AC
  Administered 2017-12-16: 2.5 mg via RESPIRATORY_TRACT

## 2017-12-16 MED ORDER — IPRATROPIUM BROMIDE 0.02 % IN SOLN
0.5000 mg | Freq: Once | RESPIRATORY_TRACT | Status: AC
  Administered 2017-12-16: 0.5 mg via RESPIRATORY_TRACT

## 2017-12-16 MED ORDER — ATORVASTATIN CALCIUM 10 MG PO TABS: 5.0000 mg | ORAL_TABLET | Freq: Every day | ORAL | 3 refills | Status: DC

## 2017-12-16 MED ORDER — AZELASTINE HCL 0.1 % NA SOLN: 1.0000 | Freq: Two times a day (BID) | NASAL | 11 refills | Status: AC

## 2017-12-16 NOTE — Progress Notes (Signed)
Subjective:    Patient ID: Julie Nixon, female    DOB: [birth year], 56 y.o.   MRN: [id number] Chief Complaint  Patient presents with  . Annual Exam    HPI Julie Nixon is a 56 yo woman here today for her complete physical exam. This is my first time meeting this patient. She was previously cared for by my retired Animatorcolleague of Dr. Cleta Albertsaub who last saw her 1 year prior for the same.  When pt has been laying down at night she feels a jolt in chest - startling - it might wake her up from sleep. Started about 6 wks prior and she has been under increased stress.  No palpitations. Within several minutes of laying down at night she gets lightheaded occasionally.  She wonders if this could be due to her anxiety from her family history.  No other asstd sxs. Does have occ hot flashes from menopause and noting she has been feeling warmer overall.  Primary Preventative Screenings: Cervical Cancer: Follows with gyn Dr. Juliene PinaMody as did have high grade abnml pap in 2015 - on Premarin 2 times per week. Her appt with her gyn on 1/22 STI screen: Neg Hep C last yr Breast Cancer: Annual mammograms done at the breast center every August. Colorectal Cancer: Had a tubular adenoma polyp and a hyperplastic/lymphoid aggregate polyp on last colonoscopy 02/2014 by Dr. Elnoria HowardHung. Repeat recommended in 5 years. Cardiac: + FHx of premature CAD - her mother developed known prob at 54 yo but prob started much earlier and ignored and her father with CAD at 850 yo. His brother passed at 54 yo from MI.  She has 9 older siblings - oldest 2118 yrs older than her - 54 yo. Both parents had to have CABG. nml EKG 12/13/16 Weight/blood sugar: Bone Density: Vit D nml at 49 5 yrs prior Immunizations: TDaP 2011, prevnar 2016, hep B 2013 OTC/vit/supp/herbal: Folic acid only due to premarin.  Diet/Exercise:  Walking only and poor diet.  Dentist/Optho:   Overdue for optho - goes every 2 years but not dental.  Chronic Medical  Conditions: Hypothyroidism: Followed by Dr. Everardo AllEllison - currently being seen annually. Tobacco use with h/o Asthma and environmental allergies: 1 ppd currently.  Not ready to quit currently though she hates the smell. Pre-contemplative for cessation. Has smoked 1 pack per day for over 3 decades. Failed the patch. Prn albuterol and prn qvar. She did get more patches and gum from [facility]. Has never tried wellbutrin. Chantix caused severe n/v - which made her smoke more.  3 yrs ago she stopped smoking for 14 mos. She has had a hard time with asthma flair this past wk - does not have a maintanence inhaler and only used albuterol once this past week. Stays on the allergra-D then astelin once a week with rare flonase.  HLD: On lipitor 5 mg.  Lipid panel last year was excellent at LDL 77 and non-HDL of 112 Mood d/o: On low dose Effexor XR 37.5mg  qd - she is using it for hot flashes and works very well and evens out mood - she doesn't like it as when she misses a dose she feels funny so some day when menopause is a distant hx and she gets cats again she will come off of it. Fx of CAD: seen at [facility] Dr. Rennis GoldenHilty who rec CAC score but insurance refused.  She was advised a stress test would be of low yield so not other w/u  or f/u planned.  She is walking for exercise as well as swims in the summer and uses a rotator board core and she feels like it is working well - used it for sev mos now.  Does not use salt at all.   Always has chronic sinus issues. Reports that they are on usual sxs now. Vosol HC gtts worked Firefighter.  Stays on Allegra-D and uses asteline and flonase prn. Allergy shots for 36-33 yo.  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 Allergy   . Anxiety   . Asthma   . Hyperlipidemia   . Thyroid disease    Past Surgical History:  Procedure Laterality Date  . MOUTH SURGERY     Current Outpatient Medications on  File Prior to Visit  Medication Sig Dispense Refill  . acetic acid-hydrocortisone (VOSOL-HC) otic solution Place 3 drops into the right ear 3 (three) times daily. 10 mL 0  . albuterol (PROVENTIL HFA;VENTOLIN HFA) 108 (90 BASE) MCG/ACT inhaler Inhale 2 puffs into the lungs every 6 (six) hours as needed for wheezing. 18 g 2  . Albuterol Sulfate (PROAIR RESPICLICK) 108 (90 Base) MCG/ACT AEPB Inhale 2 puffs into the lungs every 4 (four) hours as needed. 1 each 0  . amoxicillin-clavulanate (AUGMENTIN) 875-125 MG tablet Take 1 tablet by mouth 2 (two) times daily. 20 tablet 0  . atorvastatin (LIPITOR) 10 MG tablet Take 0.5 tablets (5 mg total) by mouth daily. 90 tablet 3  . azelastine (ASTELIN) 0.1 % nasal spray Place 1 spray into both nostrils 2 (two) times daily. Use in each nostril as directed 30 mL 11  . estrogens, conjugated, (PREMARIN) 0.45 MG tablet Take 0.45 mg by mouth daily. Take daily for 21 days then do not take for 7 days.    . fexofenadine-pseudoephedrine (ALLEGRA-D 24) 180-240 MG per 24 hr tablet Take 1 tablet by mouth daily. 30 tablet 11  . fluticasone (FLONASE) 50 MCG/ACT nasal spray Place 2 sprays into both nostrils daily. 16 g 11  . folic acid (FOLVITE) 1 MG tablet Take 1 mg by mouth daily.    Marland Kitchen levothyroxine (SYNTHROID, LEVOTHROID) 112 MCG tablet TAKE 1 TABLET BY MOUTH DAILY BEFORE BREAKFAST 90 tablet 1  . meloxicam (MOBIC) 15 MG tablet Take 1 tablet (15 mg total) by mouth daily as needed for pain. 30 tablet 1  . venlafaxine XR (EFFEXOR-XR) 37.5 MG 24 hr capsule Take 1 capsule (37.5 mg total) by mouth daily. 90 capsule 3   No current facility-administered medications on file prior to visit.    Allergies  Allergen Reactions  . Egg Yolk    Family History  Problem Relation Age of Onset  . Hypertension Mother   . Heart Problems Mother        CABG  . Hyperlipidemia Mother   . Hypertension Father   . Heart Problems Father        CABG  . Hyperlipidemia Father   . Hypertension  Sister   . Heart Problems Sister   . Hypertension Sister   . Heart Problems Sister   . Hypertension Sister   . Heart Problems Sister   . Heart disease Brother        massive heart attack  . Heart disease Brother   . Hyperlipidemia Brother   . Hypertension Brother    Social History   Socioeconomic History  . Marital status: Single    Spouse name:  Not on file  . Number of children: Not on file  . Years of education: Not on file  . Highest education level: Not on file  Social Needs  . Financial resource strain: Not on file  . Food insecurity - worry: Not on file  . Food insecurity - inability: Not on file  . Transportation needs - medical: Not on file  . Transportation needs - non-medical: Not on file  Occupational History  . Occupation: Magazine features editor: [employer]  . Occupation: Server  Tobacco Use  . Smoking status: Current Every Day Smoker    Packs/day: 1.00    Years: 31.00    Pack years: 31.00    Types: Cigarettes    Last attempt to quit: 08/06/2012    Years since quitting: 5.3  . Smokeless tobacco: Never Used  . Tobacco comment: trying to quit - on & off for 3 years - down to 1/2 ppd  Substance and Sexual Activity  . Alcohol use: No  . Drug use: No  . Sexual activity: Yes  Other Topics Concern  . Not on file  Social History Narrative   Divorced. Education: [employer]. Exercise: Elliptical/swim 3 times a week for 30 minutes.   Ex-husband addicted to narcotics and is anxious about becoming addicted to any type of medication   Depression screen [facility] 2/9 12/16/2017 05/04/2017 12/13/2016 12/08/2015 10/12/2014  Decreased Interest 0 0 0 0 0  Down, Depressed, Hopeless 0 0 0 0 0  PHQ - 2 Score 0 0 0 0 0    Review of Systems  HENT: Positive for congestion, ear discharge, postnasal drip, rhinorrhea, sinus pressure, sinus pain and sneezing.   Eyes: Positive for itching.  Respiratory: Positive for chest tightness.   Allergic/Immunologic: Positive for  environmental allergies.  All other systems reviewed and are negative.  See HPI    Objective:   Physical Exam  Constitutional: She is oriented to person, place, and time. She appears well-developed and well-nourished. No distress.  HENT:  Head: Normocephalic and atraumatic.  Right Ear: Tympanic membrane, external ear and ear canal normal.  Left Ear: Tympanic membrane, external ear and ear canal normal.  Nose: Nose normal. No mucosal edema or rhinorrhea.  Mouth/Throat: Uvula is midline, oropharynx is clear and moist and mucous membranes are normal. No posterior oropharyngeal erythema.  Eyes: Conjunctivae and EOM are normal. Pupils are equal, round, and reactive to light. Right eye exhibits no discharge. Left eye exhibits no discharge. No scleral icterus.  Neck: Normal range of motion. Neck supple. No thyromegaly present.  Cardiovascular: Normal rate, regular rhythm, normal heart sounds and intact distal pulses.  Pulmonary/Chest: Effort normal and breath sounds normal. No respiratory distress.  Abdominal: Soft. Bowel sounds are normal. There is no tenderness.  Musculoskeletal: She exhibits no edema.  Lymphadenopathy:    She has no cervical adenopathy.  Neurological: She is alert and oriented to person, place, and time. She has normal reflexes.  Skin: Skin is warm and dry. She is not diaphoretic. No erythema.  Psychiatric: She has a normal mood and affect. Her behavior is normal.    Visual Acuity Screening   Right eye Left eye Both eyes  Without correction:     With correction: 20/25 20/25 20/20      BP 120/78   Pulse 72   Temp 98.2 F (36.8 C)   Resp 16   Ht 5\' 7"  (1.702 m)   Wt 167 lb (75.8 kg)   LMP 10/27/2013  SpO2 98%   BMI 26.16 kg/m      Assessment & Plan:  Will be due for levothyroxine refill around April - ok to refill as long as TSH today unchangex 1 yr when requested. Pt refuses Flu shot   . Family history of coronary artery disease occurring prior to 2857  years of age - pt with early heart disease in mother, father, and brother. Pt was [facility] who rec coronary artery calcium score as stress test was likely be to very low yeild but ins wouldn't cover so she was rec to cont to aggressively control risk factors. However, pt has no interest in smoking cessation which is by far her biggest risk. Will check hs-crp and apoB to try to get some i dea of her current genetic risk. Start asa 83.        1. Annual physical exam  - well woman care with gyn Dr. Juliene PinaMody  2. Hypothyroidism, postradioiodine therapy - TSH stable for years, refill levothyroxine 112 mcg x 1 yr whenever requested. Also follows annually with endocrine Dr. Everardo AllEllison  3. Mixed hyperlipidemia - cont lipitor 5 - lipids increased a little this year so work on tlc - if not improved next yr, may want to increase dose to 10mg   4. Smoker - encouraged cessation, pt contemplative, will be eligible for LDCT lung cancer screening next yr  5. Wheezing  - refilled prn albuterol, uses rarely  6. Chronic non-seasonal allergic rhinitis   7. Chronic back pain, unspecified back location, unspecified back pain laterality  - prn mobic    Orders Placed This Encounter  Procedures  . CBC with Differential/Platelet  . Comprehensive metabolic panel    Order Specific Question:   Has the patient fasted?    Answer:   Yes  . Lipid panel    Order Specific Question:   Has the patient fasted?    Answer:   Yes  . TSH  . POCT urinalysis dipstick    Meds ordered this encounter  Medications  . albuterol (PROVENTIL) (2.5 MG/3ML) 0.083% nebulizer solution 2.5 mg  . ipratropium (ATROVENT) nebulizer solution 0.5 mg  . acetic acid-hydrocortisone (VOSOL-HC) OTIC solution    Sig: Place 3 drops into the right ear 3 (three) times daily.    Dispense:  10 mL    Refill:  0  . Albuterol Sulfate (PROAIR RESPICLICK) 108 (90 Base) MCG/ACT AEPB    Sig: Inhale 2 puffs into the lungs every 4  (four) hours as needed.    Dispense:  1 each    Refill:  0  . atorvastatin (LIPITOR) 10 MG tablet    Sig: Take 0.5 tablets (5 mg total) by mouth daily.    Dispense:  90 tablet    Refill:  3  . azelastine (ASTELIN) 0.1 % nasal spray    Sig: Place 1 spray into both nostrils 2 (two) times daily. Use in each nostril as directed    Dispense:  30 mL    Refill:  11  . meloxicam (MOBIC) 15 MG tablet    Sig: Take 1 tablet (15 mg total) by mouth daily as needed for pain.    Dispense:  30 tablet    Refill:  1  . venlafaxine XR (EFFEXOR-XR) 37.5 MG 24 hr capsule    Sig: Take 1 capsule (37.5 mg total) by mouth daily.    Dispense:  90 capsule    Refill:  3    Norberto SorensonEva Rakeen Gaillard,  M.D.  [facility] [address] [place], [place] [address] [phone] phone [phone] fax  12/16/17 9:23 AM

## 2017-12-16 NOTE — Patient Instructions (Addendum)
   IF you received an x-ray today, you will receive an invoice from [facility]. Please contact [facility] at [phone] with questions or concerns regarding your invoice.   IF you received labwork today, you will receive an invoice from [facility]. Please contact [facility] at [phone] with questions or concerns regarding your invoice.   Our billing staff will not be able to assist you with questions regarding bills from these companies.  You will be contacted with the lab results as soon as they are available. The fastest way to get your results is to activate your My Chart account. Instructions are located on the last page of this paperwork. If you have not heard from us regarding the results in 2 weeks, please contact this office.     Health Maintenance for Postmenopausal Women Menopause is a normal process in which your reproductive ability comes to an end. This process happens gradually over a span of months to years, usually between the ages of 48 and 55. Menopause is complete when you have missed 12 consecutive menstrual periods. It is important to talk with your health care provider about some of the most common conditions that affect postmenopausal women, such as heart disease, cancer, and bone loss (osteoporosis). Adopting a healthy lifestyle and getting preventive care can help to promote your health and wellness. Those actions can also lower your chances of developing some of these common conditions. What should I know about menopause? During menopause, you may experience a number of symptoms, such as:  Moderate-to-severe hot flashes.  Night sweats.  Decrease in sex drive.  Mood swings.  Headaches.  Tiredness.  Irritability.  Memory problems.  Insomnia.  Choosing to treat or not to treat menopausal changes is an individual decision that you make with your health care provider. What should I know about hormone replacement therapy and  supplements? Hormone therapy products are effective for treating symptoms that are associated with menopause, such as hot flashes and night sweats. Hormone replacement carries certain risks, especially as you become older. If you are thinking about using estrogen or estrogen with progestin treatments, discuss the benefits and risks with your health care provider. What should I know about heart disease and stroke? Heart disease, heart attack, and stroke become more likely as you age. This may be due, in part, to the hormonal changes that your body experiences during menopause. These can affect how your body processes dietary fats, triglycerides, and cholesterol. Heart attack and stroke are both medical emergencies. There are many things that you can do to help prevent heart disease and stroke:  Have your blood pressure checked at least every 1-2 years. High blood pressure causes heart disease and increases the risk of stroke.  If you are 55-79 years old, ask your health care provider if you should take aspirin to prevent a heart attack or a stroke.  Do not use any tobacco products, including cigarettes, chewing tobacco, or electronic cigarettes. If you need help quitting, ask your health care provider.  It is important to eat a healthy diet and maintain a healthy weight. ? Be sure to include plenty of vegetables, fruits, low-fat dairy products, and lean protein. ? Avoid eating foods that are high in solid fats, added sugars, or salt (sodium).  Get regular exercise. This is one of the most important things that you can do for your health. ? Try to exercise for at least 150 minutes each week. The type of exercise that you do should increase your   your heart rate and make you sweat. This is known as moderate-intensity exercise. ? Try to do strengthening exercises at least twice each week. Do these in addition to the moderate-intensity exercise.  Know your numbers.Ask your health care provider to check  your cholesterol and your blood glucose. Continue to have your blood tested as directed by your health care provider.  What should I know about cancer screening? There are several types of cancer. Take the following steps to reduce your risk and to catch any cancer development as early as possible. Breast Cancer  Practice breast self-awareness. ? This means understanding how your breasts normally appear and feel. ? It also means doing regular breast self-exams. Let your health care provider know about any changes, no matter how small.  If you are 42 or older, have a clinician do a breast exam (clinical breast exam or CBE) every year. Depending on your age, family history, and medical history, it may be recommended that you also have a yearly breast X-ray (mammogram).  If you have a family history of breast cancer, talk with your health care provider about genetic screening.  If you are at high risk for breast cancer, talk with your health care provider about having an MRI and a mammogram every year.  Breast cancer (BRCA) gene test is recommended for women who have family members with BRCA-related cancers. Results of the assessment will determine the need for genetic counseling and BRCA1 and for BRCA2 testing. BRCA-related cancers include these types: ? Breast. This occurs in males or females. ? Ovarian. ? Tubal. This may also be called fallopian tube cancer. ? Cancer of the abdominal or pelvic lining (peritoneal cancer). ? Prostate. ? Pancreatic.  Cervical, Uterine, and Ovarian Cancer Your health care provider may recommend that you be screened regularly for cancer of the pelvic organs. These include your ovaries, uterus, and vagina. This screening involves a pelvic exam, which includes checking for microscopic changes to the surface of your cervix (Pap test).  For women ages 21-65, health care providers may recommend a pelvic exam and a Pap test every three years. For women ages 77-65,  they may recommend the Pap test and pelvic exam, combined with testing for human papilloma virus (HPV), every five years. Some types of HPV increase your risk of cervical cancer. Testing for HPV may also be done on women of any age who have unclear Pap test results.  Other health care providers may not recommend any screening for nonpregnant women who are considered low risk for pelvic cancer and have no symptoms. Ask your health care provider if a screening pelvic exam is right for you.  If you have had past treatment for cervical cancer or a condition that could lead to cancer, you need Pap tests and screening for cancer for at least 20 years after your treatment. If Pap tests have been discontinued for you, your risk factors (such as having a new sexual partner) need to be reassessed to determine if you should start having screenings again. Some women have medical problems that increase the chance of getting cervical cancer. In these cases, your health care provider may recommend that you have screening and Pap tests more often.  If you have a family history of uterine cancer or ovarian cancer, talk with your health care provider about genetic screening.  If you have vaginal bleeding after reaching menopause, tell your health care provider.  There are currently no reliable tests available to screen for ovarian cancer.  Cancer Lung cancer screening is recommended for adults 55-80 years old who are at high risk for lung cancer because of a history of smoking. A yearly low-dose CT scan of the lungs is recommended if you:  Currently smoke.  Have a history of at least 30 pack-years of smoking and you currently smoke or have quit within the past 15 years. A pack-year is smoking an average of one pack of cigarettes per day for one year.  Yearly screening should:  Continue until it has been 15 years since you quit.  Stop if you develop a health problem that would prevent you from having lung  cancer treatment.  Colorectal Cancer  This type of cancer can be detected and can often be prevented.  Routine colorectal cancer screening usually begins at age 50 and continues through age 75.  If you have risk factors for colon cancer, your health care provider may recommend that you be screened at an earlier age.  If you have a family history of colorectal cancer, talk with your health care provider about genetic screening.  Your health care provider may also recommend using home test kits to check for hidden blood in your stool.  A small camera at the end of a tube can be used to examine your colon directly (sigmoidoscopy or colonoscopy). This is done to check for the earliest forms of colorectal cancer.  Direct examination of the colon should be repeated every 5-10 years until age 75. However, if early forms of precancerous polyps or small growths are found or if you have a family history or genetic risk for colorectal cancer, you may need to be screened more often.  Skin Cancer  Check your skin from head to toe regularly.  Monitor any moles. Be sure to tell your health care provider: ? About any new moles or changes in moles, especially if there is a change in a mole's shape or color. ? If you have a mole that is larger than the size of a pencil eraser.  If any of your family members has a history of skin cancer, especially at a young age, talk with your health care provider about genetic screening.  Always use sunscreen. Apply sunscreen liberally and repeatedly throughout the day.  Whenever you are outside, protect yourself by wearing long sleeves, pants, a wide-brimmed hat, and sunglasses.  What should I know about osteoporosis? Osteoporosis is a condition in which bone destruction happens more quickly than new bone creation. After menopause, you may be at an increased risk for osteoporosis. To help prevent osteoporosis or the bone fractures that can happen because of  osteoporosis, the following is recommended:  If you are 19-50 years old, get at least 1,000 mg of calcium and at least 600 mg of vitamin D per day.  If you are older than age 50 but younger than age 70, get at least 1,200 mg of calcium and at least 600 mg of vitamin D per day.  If you are older than age 70, get at least 1,200 mg of calcium and at least 800 mg of vitamin D per day.  Smoking and excessive alcohol intake increase the risk of osteoporosis. Eat foods that are rich in calcium and vitamin D, and do weight-bearing exercises several times each week as directed by your health care provider. What should I know about how menopause affects my mental health? Depression may occur at any age, but it is more common as you become older. Common symptoms of depression   include:  Low or sad mood.  Changes in sleep patterns.  Changes in appetite or eating patterns.  Feeling an overall lack of motivation or enjoyment of activities that you previously enjoyed.  Frequent crying spells.  Talk with your health care provider if you think that you are experiencing depression. What should I know about immunizations? It is important that you get and maintain your immunizations. These include:  Tetanus, diphtheria, and pertussis (Tdap) booster vaccine.  Influenza every year before the flu season begins.  Pneumonia vaccine.  Shingles vaccine.  Your health care provider may also recommend other immunizations. This information is not intended to replace advice given to you by your health care provider. Make sure you discuss any questions you have with your health care provider. Document Released: 02/08/2006 Document Revised: 07/06/2016 Document Reviewed: 09/20/2015 Elsevier Interactive Patient Education  2018 [employer]  

## 2017-12-17 LAB — LIPID PANEL
Chol/HDL Ratio: 3.9 ratio (ref 0.0–4.4)
Cholesterol, Total: 208 mg/dL — ABNORMAL HIGH (ref 100–199)
HDL: 54 mg/dL (ref >39)
LDL Calculated: 121 mg/dL — ABNORMAL HIGH (ref 0–99)
Triglycerides: 165 mg/dL — ABNORMAL HIGH (ref 0–149)
VLDL CHOLESTEROL CAL: 33 mg/dL (ref 5–40)

## 2017-12-17 LAB — CBC WITH DIFFERENTIAL/PLATELET
BASOS ABS: 0.1 10*3/uL (ref 0.0–0.2)
BASOS: 1 %
EOS (ABSOLUTE): 0.6 10*3/uL — ABNORMAL HIGH (ref 0.0–0.4)
EOS: 8 %
HEMATOCRIT: 42.6 % (ref 34.0–46.6)
HEMOGLOBIN: 14.3 g/dL (ref 11.1–15.9)
IMMATURE GRANS (ABS): 0 10*3/uL (ref 0.0–0.1)
Immature Granulocytes: 0 %
LYMPHS ABS: 3.1 10*3/uL (ref 0.7–3.1)
LYMPHS: 41 %
MCH: 31.4 pg (ref 26.6–33.0)
MCHC: 33.6 g/dL (ref 31.5–35.7)
MCV: 94 fL (ref 79–97)
MONOCYTES: 9 %
Monocytes Absolute: 0.7 10*3/uL (ref 0.1–0.9)
NEUTROS ABS: 3.2 10*3/uL (ref 1.4–7.0)
Neutrophils: 41 %
Platelets: 324 10*3/uL (ref 150–379)
RBC: 4.55 x10E6/uL (ref 3.77–5.28)
RDW: 12.7 % (ref 12.3–15.4)
WBC: 7.6 10*3/uL (ref 3.4–10.8)

## 2017-12-17 LAB — COMPREHENSIVE METABOLIC PANEL
ALBUMIN: 4.5 g/dL (ref 3.5–5.5)
ALK PHOS: 61 IU/L (ref 39–117)
ALT: 17 IU/L (ref 0–32)
AST: 16 IU/L (ref 0–40)
Albumin/Globulin Ratio: 1.7 (ref 1.2–2.2)
BILIRUBIN TOTAL: 0.2 mg/dL (ref 0.0–1.2)
BUN / CREAT RATIO: 17 (ref 9–23)
BUN: 11 mg/dL (ref 6–24)
CHLORIDE: 102 mmol/L (ref 96–106)
CO2: 24 mmol/L (ref 20–29)
Calcium: 9.2 mg/dL (ref 8.7–10.2)
Creatinine, Ser: 0.65 mg/dL (ref 0.57–1.00)
GFR calc non Af Amer: 101 mL/min/{1.73_m2} (ref >59)
GFR, EST AFRICAN AMERICAN: 116 mL/min/{1.73_m2} (ref >59)
GLOBULIN, TOTAL: 2.6 g/dL (ref 1.5–4.5)
GLUCOSE: 96 mg/dL (ref 65–99)
Potassium: 4.2 mmol/L (ref 3.5–5.2)
SODIUM: 140 mmol/L (ref 134–144)
TOTAL PROTEIN: 7.1 g/dL (ref 6.0–8.5)

## 2017-12-17 LAB — TSH: TSH: 1.33 u[IU]/mL (ref 0.450–4.500)

## 2018-03-20 ENCOUNTER — Ambulatory Visit: Payer: BC Managed Care – PPO | Admitting: Endocrinology

## 2018-03-20 ENCOUNTER — Encounter: Payer: Self-pay | Admitting: Endocrinology

## 2018-03-20 VITALS — BP 126/72 | HR 73 | Wt 168.0 lb

## 2018-03-20 DIAGNOSIS — E890 Postprocedural hypothyroidism: Secondary | ICD-10-CM

## 2018-03-20 NOTE — Patient Instructions (Signed)
Please continue the same thyroid medication. Please come back for a follow-up appointment in 1 year.  

## 2018-03-20 NOTE — Progress Notes (Signed)
Subjective:    Patient ID: Julie Nixon, female    DOB: 08-01-1961, 57 y.o.   MRN: [id number]  HPI Pt returns for f/u of post-RAI hypothyroidism (in late 2014, pt was dx'ed with hyperthyroidism, due to Grave's dz.  She took RAI in January of 2015.  She was rx'ed synthroid soon thereafter).  pt states she feels well in general.  She has lost a few lbs, due to her efforts.  Past Medical History:  Diagnosis Date  . Allergy   . Anxiety   . Asthma   . Hyperlipidemia   . Thyroid disease     Past Surgical History:  Procedure Laterality Date  . MOUTH SURGERY      Social History   Socioeconomic History  . Marital status: Single    Spouse name: Not on file  . Number of children: Not on file  . Years of education: Not on file  . Highest education level: Not on file  Occupational History  . Occupation: Magazine features editorTeacher    Employer: [employer]  . Occupation: Academic librarianerver  Social Needs  . Financial resource strain: Not on file  . Food insecurity:    Worry: Not on file    Inability: Not on file  . Transportation needs:    Medical: Not on file    Non-medical: Not on file  Tobacco Use  . Smoking status: Current Every Day Smoker    Packs/day: 1.00    Years: 31.00    Pack years: 31.00    Types: Cigarettes    Last attempt to quit: 08/06/2012    Years since quitting: 5.6  . Smokeless tobacco: Never Used  . Tobacco comment: trying to quit - on & off for 3 years - down to 1/2 ppd  Substance and Sexual Activity  . Alcohol use: No  . Drug use: No  . Sexual activity: Yes  Lifestyle  . Physical activity:    Days per week: Not on file    Minutes per session: Not on file  . Stress: Not on file  Relationships  . Social connections:    Talks on phone: Not on file    Gets together: Not on file    Attends religious service: Not on file    Active member of club or organization: Not on file    Attends meetings of clubs or organizations: Not on file    Relationship status: Not on  file  . Intimate partner violence:    Fear of current or ex partner: Not on file    Emotionally abused: Not on file    Physically abused: Not on file    Forced sexual activity: Not on file  Other Topics Concern  . Not on file  Social History Narrative   Divorced. Education: [employer]. Exercise: Elliptical/swim 3 times a week for 30 minutes.   Ex-husband addicted to narcotics and is anxious about becoming addicted to any type of medication    Current Outpatient Medications on File Prior to Visit  Medication Sig Dispense Refill  . Albuterol Sulfate (PROAIR RESPICLICK) 108 (90 Base) MCG/ACT AEPB Inhale 2 puffs into the lungs every 4 (four) hours as needed. 1 each 0  . atorvastatin (LIPITOR) 10 MG tablet Take 0.5 tablets (5 mg total) by mouth daily. 90 tablet 3  . azelastine (ASTELIN) 0.1 % nasal spray Place 1 spray into both nostrils 2 (two) times daily. Use in each nostril as directed 30 mL 11  . estrogens, conjugated, (PREMARIN) 0.45 MG  tablet Take 0.45 mg by mouth daily. Take daily for 21 days then do not take for 7 days.    . fexofenadine-pseudoephedrine (ALLEGRA-D 24) 180-240 MG per 24 hr tablet Take 1 tablet by mouth daily. 30 tablet 11  . fluticasone (FLONASE) 50 MCG/ACT nasal spray Place 2 sprays into both nostrils daily. 16 g 11  . folic acid (FOLVITE) 1 MG tablet Take 1 mg by mouth daily.    Marland Kitchen levothyroxine (SYNTHROID, LEVOTHROID) 112 MCG tablet TAKE 1 TABLET BY MOUTH DAILY BEFORE BREAKFAST 90 tablet 1  . meloxicam (MOBIC) 15 MG tablet Take 1 tablet (15 mg total) by mouth daily as needed for pain. 30 tablet 1  . venlafaxine XR (EFFEXOR-XR) 37.5 MG 24 hr capsule Take 1 capsule (37.5 mg total) by mouth daily. 90 capsule 3   No current facility-administered medications on file prior to visit.     Allergies  Allergen Reactions  . Egg Yolk     Family History  Problem Relation Age of Onset  . Hypertension Mother   . Heart Problems Mother        CABG  . Hyperlipidemia Mother   .  Hypertension Father   . Heart Problems Father        CABG  . Hyperlipidemia Father   . Hypertension Sister   . Heart Problems Sister   . Hypertension Sister   . Heart Problems Sister   . Hypertension Sister   . Heart Problems Sister   . Heart disease Brother        massive heart attack  . Heart disease Brother   . Hyperlipidemia Brother   . Hypertension Brother     BP 126/72 (BP Location: Left Arm, Patient Position: Sitting, Cuff Size: Normal)   Pulse 73   Wt 168 lb (76.2 kg)   LMP 10/27/2013   SpO2 96%   BMI 26.31 kg/m    Review of Systems Denies dry skin.      Objective:   Physical Exam VITAL SIGNS:  See vs page GENERAL: no distress NECK: There is no palpable thyroid enlargement.  No thyroid nodule is palpable.  No palpable lymphadenopathy at the anterior neck.   Lab Results  Component Value Date   TSH 1.330 12/16/2017   T4TOTAL 8.5 12/08/2015       Assessment & Plan:  Post-RAI hypothyroidism: well-replaced Weight loss: I advised pt to continue her efforts  Patient Instructions  Please continue the same thyroid medication.   Please come back for a follow-up appointment in 1 year.

## 2018-04-29 ENCOUNTER — Other Ambulatory Visit: Payer: Self-pay | Admitting: Family Medicine

## 2018-05-26 ENCOUNTER — Encounter: Payer: Self-pay | Admitting: Family Medicine

## 2018-07-02 ENCOUNTER — Encounter: Payer: Self-pay | Admitting: Family Medicine

## 2018-07-29 ENCOUNTER — Other Ambulatory Visit: Payer: Self-pay | Admitting: Family Medicine

## 2018-07-30 NOTE — Telephone Encounter (Signed)
Levothyroxine 112 MCG tab refill Last filled on 04/30/18 #90 tabs no refills  Dr. Clelia CroftShaw LOV 03/20/18 Pharmacy on file.  Refill completed.

## 2018-08-01 ENCOUNTER — Other Ambulatory Visit: Payer: Self-pay | Admitting: Family Medicine

## 2018-08-01 DIAGNOSIS — Z1231 Encounter for screening mammogram for malignant neoplasm of breast: Secondary | ICD-10-CM

## 2018-08-28 ENCOUNTER — Ambulatory Visit
Admission: RE | Admit: 2018-08-28 | Discharge: 2018-08-28 | Disposition: A | Discharge status: Home / Self Care | Payer: BC Managed Care – PPO | Source: Ambulatory Visit | Attending: Family Medicine | Admitting: Family Medicine

## 2018-08-28 DIAGNOSIS — Z1231 Encounter for screening mammogram for malignant neoplasm of breast: Secondary | ICD-10-CM

## 2018-09-11 ENCOUNTER — Other Ambulatory Visit: Payer: Self-pay | Admitting: Family Medicine

## 2018-11-04 ENCOUNTER — Other Ambulatory Visit: Payer: Self-pay

## 2018-11-04 ENCOUNTER — Encounter: Payer: Self-pay | Admitting: Family Medicine

## 2018-11-04 ENCOUNTER — Ambulatory Visit: Payer: BC Managed Care – PPO | Admitting: Family Medicine

## 2018-11-04 VITALS — BP 127/78 | HR 81 | Temp 98.5°F | Ht 67.0 in | Wt 166.4 lb

## 2018-11-04 DIAGNOSIS — R112 Nausea with vomiting, unspecified: Secondary | ICD-10-CM

## 2018-11-04 MED ORDER — ONDANSETRON 8 MG PO TBDP: 8.0000 mg | ORAL_TABLET | Freq: Three times a day (TID) | ORAL | 0 refills | Status: AC | PRN

## 2018-11-04 NOTE — Progress Notes (Signed)
11/5/201911:45 AM  Julie Nixon [birth year], 57 y.o. female [id number]  Chief Complaint  Patient presents with  . Emesis    woke up this morning feeling nauseous and sweating. May be the seafood salad from the night before    HPI:   Patient is a 57 y.o. female with past medical history significant for HLP, anxiety, hypothyroidism, seasonal allergies who presents today for vomiting  Woke up this morning sweating, became very nauseous, vomited x 2, non-bloody, nausea resolved No diarrhea No abd pain, no cramping No coughing, no SOB Throat is sore, hard to swallow, sparking water helps No rashes She was the only one that had seafood salad Denies any food allergies  Has strong seasonal allergies Undergoing immunotherapy Has had previous feeling of throat swollen/sore, related to PND  Works as school teacher  Fall Risk  12/16/2017 05/04/2017 12/13/2016 12/08/2015 10/12/2014  Falls in the past year? No No Yes Yes No  Number falls in past yr: - - 1 1 -  Injury with Fall? - - Yes No -  Comment - - left foot - going upstairs  - -     Depression screen [facility] 2/9 12/16/2017 05/04/2017 12/13/2016  Decreased Interest 0 0 0  Down, Depressed, Hopeless 0 0 0  PHQ - 2 Score 0 0 0    Allergies  Allergen Reactions  . Egg Yolk     Prior to Admission medications   Medication Sig Start Date End Date Taking? Authorizing Provider  Albuterol Sulfate (PROAIR RESPICLICK) 108 (90 Base) MCG/ACT AEPB Inhale 2 puffs into the lungs every 4 (four) hours as needed. 12/16/17   Sherren Mocha, MD  atorvastatin (LIPITOR) 10 MG tablet Take 0.5 tablets (5 mg total) by mouth daily. 12/16/17   Sherren Mocha, MD  azelastine (ASTELIN) 0.1 % nasal spray Place 1 spray into both nostrils 2 (two) times daily. Use in each nostril as directed 12/16/17   Sherren Mocha, MD  estrogens, conjugated, (PREMARIN) 0.45 MG tablet Take 0.45 mg by mouth daily. Take daily for 21 days then do not take for 7 days.    [provider]  fexofenadine-pseudoephedrine (ALLEGRA-D 24) 180-240 MG per 24 hr tablet Take 1 tablet by mouth daily. 09/22/13   Collene Gobble, MD  fluticasone (FLONASE) 50 MCG/ACT nasal spray Place 2 sprays into both nostrils daily. 09/12/14   Weber, Dema Severin, PA-C  folic acid (FOLVITE) 1 MG tablet Take 1 mg by mouth daily.    [provider]  levothyroxine (SYNTHROID, LEVOTHROID) 112 MCG tablet TAKE 1 TABLET BY MOUTH DAILY BEFORE BREAKFAST 09/11/18   Sherren Mocha, MD  meloxicam (MOBIC) 15 MG tablet Take 1 tablet (15 mg total) by mouth daily as needed for pain. 12/16/17   Sherren Mocha, MD  venlafaxine XR (EFFEXOR-XR) 37.5 MG 24 hr capsule Take 1 capsule (37.5 mg total) by mouth daily. 12/16/17   Sherren Mocha, MD    Past Medical History:  Diagnosis Date  . Allergy   . Anxiety   . Asthma   . Hyperlipidemia   . Thyroid disease     Past Surgical History:  Procedure Laterality Date  . MOUTH SURGERY      Social History   Tobacco Use  . Smoking status: Current Every Day Smoker    Packs/day: 1.00    Years: 31.00    Pack years: 31.00    Types: Cigarettes    Last attempt to quit: 08/06/2012  Years since quitting: 6.2  . Smokeless tobacco: Never Used  . Tobacco comment: trying to quit - on & off for 3 years - down to 1/2 ppd  Substance Use Topics  . Alcohol use: No    Family History  Problem Relation Age of Onset  . Hypertension Mother   . Heart Problems Mother        CABG  . Hyperlipidemia Mother   . Hypertension Father   . Heart Problems Father        CABG  . Hyperlipidemia Father   . Hypertension Sister   . Heart Problems Sister   . Hypertension Sister   . Heart Problems Sister   . Hypertension Sister   . Heart Problems Sister   . Heart disease Brother        massive heart attack  . Heart disease Brother   . Hyperlipidemia Brother   . Hypertension Brother     ROS Per hpi  OBJECTIVE:  Blood pressure 127/78, pulse 81, temperature 98.5 F (36.9 C),  temperature source Oral, height 5\' 7"  (1.702 m), weight 166 lb 6.4 oz (75.5 kg), last menstrual period 10/27/2013, SpO2 97 %. Body mass index is 26.06 kg/m.   Physical Exam  Constitutional: She is oriented to person, place, and time. She appears well-developed and well-nourished.  Non-toxic appearance. No distress.  HENT:  Head: Normocephalic and atraumatic.  Mouth/Throat: No uvula swelling. Posterior oropharyngeal edema and posterior oropharyngeal erythema present. No oropharyngeal exudate.  Eyes: Pupils are equal, round, and reactive to light. Conjunctivae and EOM are normal. No scleral icterus.  Neck: Neck supple.  Cardiovascular: Normal rate, regular rhythm and normal heart sounds. Exam reveals no gallop and no friction rub.  No murmur heard. Pulmonary/Chest: Effort normal and breath sounds normal. She has no wheezes. She has no rales.  Abdominal: Soft. Bowel sounds are normal. She exhibits no distension. There is tenderness (lower abdomen). There is no rebound and no guarding.  Musculoskeletal: She exhibits no edema.  Neurological: She is alert and oriented to person, place, and time.  Skin: Skin is warm and dry.  Psychiatric: She has a normal mood and affect.  Nursing note and vitals reviewed.    ASSESSMENT and PLAN  1. Nausea and vomiting in adult Currently not nauseous, much better since this morning. No resp compromise. Most likely food poisoning, can also be early viral gastroenteritis. Supportive measures discussed. Strict ER precautions given.  Other orders - ondansetron (ZOFRAN-ODT) 8 MG disintegrating tablet; Take 1 tablet (8 mg total) by mouth every 8 (eight) hours as needed for nausea.  Return if symptoms worsen or fail to improve.    Myles Lipps, MD Primary Care at [facility] [address] [place], [place] [address] Ph.  [phone] Fax [phone]

## 2018-11-04 NOTE — Patient Instructions (Addendum)
If you have lab work done today you will be contacted with your lab results within the next 2 weeks.  If you have not heard from [place] then please contact us. The fastest way to get your results is to register for My Chart.   IF you received an x-ray today, you will receive an invoice from [facility]. Please contact [facility] at [phone] with questions or concerns regarding your invoice.   IF you received labwork today, you will receive an invoice from [place]. Please contact [facility] at [phone] with questions or concerns regarding your invoice.   Our billing staff will not be able to assist you with questions regarding bills from these companies.  You will be contacted with the lab results as soon as they are available. The fastest way to get your results is to activate your My Chart account. Instructions are located on the last page of this paperwork. If you have not heard from [place] regarding the results in 2 weeks, please contact this office.    Nausea  Nausea and Vomiting, Adult Feeling sick to your stomach (nausea) means that your stomach is upset or you feel like you have to throw up (vomit). Feeling more and more sick to your stomach can lead to throwing up. Throwing up happens when food and liquid from your stomach are thrown up and out the mouth. Throwing up can make you feel weak and cause you to get dehydrated. Dehydration can make you tired and thirsty, make you have a dry mouth, and make it so you pee (urinate) less often. Older adults and people with other diseases or a weak defense system (immune system) are at higher risk for dehydration. If you feel sick to your stomach or if you throw up, it is important to follow instructions from your doctor about how to take care of yourself. Follow these instructions at home: Eating and drinking Follow these instructions as told by your doctor:  Take an oral rehydration solution (ORS). This is a drink that is  sold at pharmacies and stores.  Drink clear fluids in small amounts as you are able, such as: ? Water. ? Ice chips. ? Diluted fruit juice. ? Low-calorie sports drinks.  Eat bland, easy-to-digest foods in small amounts as you are able, such as: ? Bananas. ? Applesauce. ? Rice. ? Low-fat (lean) meats. ? Toast. ? Crackers.  Avoid fluids that have a lot of sugar or caffeine in them.  Avoid alcohol.  Avoid spicy or fatty foods.  General instructions  Drink enough fluid to keep your pee (urine) clear or pale yellow.  Wash your hands often. If you cannot use soap and water, use hand sanitizer.  Make sure that all people in your home wash their hands well and often.  Take over-the-counter and prescription medicines only as told by your doctor.  Rest at home while you get better.  Watch your condition for any changes.  Breathe slowly and deeply when you feel sick to your stomach.  Keep all follow-up visits as told by your doctor. This is important. Contact a doctor if:  You have a fever.  You cannot keep fluids down.  Your symptoms get worse.  You have new symptoms.  You feel sick to your stomach for more than two days.  You feel light-headed or dizzy.  You have a headache.  You have muscle cramps. Get help right away if:  You have pain in your chest, neck, arm, or jaw.  You feel very weak or you pass out (faint).  You throw up again and again.  You see blood in your throw-up.  Your throw-up looks like black coffee grounds.  You have bloody or black poop (stools) or poop that look like tar.  You have a very bad headache, a stiff neck, or both.  You have a rash.  You have very bad pain, cramping, or bloating in your belly (abdomen).  You have trouble breathing.  You are breathing very quickly.  Your heart is beating very quickly.  Your skin feels cold and clammy.  You feel confused.  You have pain when you pee.  You have signs of  dehydration, such as: ? Dark pee, hardly any pee, or no pee. ? Cracked lips. ? Dry mouth. ? Sunken eyes. ? Sleepiness. ? Weakness. These symptoms may be an emergency. Do not wait to see if the symptoms will go away. Get medical help right away. Call your local emergency services (911 in the [place] Do not drive yourself to the hospital. This information is not intended to replace advice given to you by your health care provider. Make sure you discuss any questions you have with your health care provider. Document Released: 06/04/2008 Document Revised: 07/06/2016 Document Reviewed: 08/23/2015 Elsevier Interactive Patient Education  2018 [employer].

## 2018-11-05 ENCOUNTER — Encounter: Payer: Self-pay | Admitting: Family Medicine

## 2018-11-06 ENCOUNTER — Telehealth: Payer: Self-pay | Admitting: Family Medicine

## 2018-11-06 NOTE — Telephone Encounter (Signed)
Copied from CRM [id number]. Topic: Quick Communication - See Telephone Encounter >> Nov 06, 2018  4:19 PM Jens Som A wrote: CRM for notification. See Telephone encounter for: 11/06/18.  Patient is requesting a referral for a rheumatologist from Dr. Clelia Croft.  Please advise [phone]

## 2018-11-07 NOTE — Telephone Encounter (Signed)
What for?  Highly recommend OV here first - rheum likes to have some of the basic bloodwork/needed imaging done first so they can review at visit and decide what steps or direction to go next.

## 2018-11-08 NOTE — Telephone Encounter (Signed)
Spoke with pt an advised per shaw highly recommend OV here first - rheum likes to have some of the basic bloodwork/needed imaging done first so they can review at visit and decide what steps or direction to go next.  Pt agreeable and has appt scheduled for 12/18/18 with shaw and can address this at this appt.  Per pt she was having bad sweats but vitals normal- but is better now.  Advised if she has sweats or any other serious symptoms to call office for appt with another provider as shaw is booked until 12/18/18.  Pt agreeable. Dgaddy, CMA

## 2018-12-10 ENCOUNTER — Other Ambulatory Visit: Payer: Self-pay | Admitting: Family Medicine

## 2018-12-10 NOTE — Telephone Encounter (Signed)
Requested medication (s) are due for refill today -yes  Requested medication (s) are on the active medication list -yes  Future visit scheduled -yes  Last refill: Effexor- 12/16/17                  Levothyroxine- 09/11/18   Notes to clinic: Attempted to call patient to schedule appointment - left message to call back to schedule. Refill request sent for review   Requested Prescriptions  Pending Prescriptions Disp Refills   venlafaxine XR (EFFEXOR-XR) 37.5 MG 24 hr capsule [Pharmacy Med Name: VENLAFAXINE ER 37.5MG  CAPSULES] 90 capsule 0    Sig: TAKE 1 CAPSULE(37.5 MG) BY MOUTH DAILY     Psychiatry: Antidepressants - SNRI - desvenlafaxine & venlafaxine Failed - 12/10/2018  6:13 AM      Failed - LDL in normal range and within 360 days    LDL Calculated  Date Value Ref Range Status  12/16/2017 121 (H) 0 - 99 mg/dL Final         Failed - Total Cholesterol in normal range and within 360 days    Cholesterol, Total  Date Value Ref Range Status  12/16/2017 208 (H) 100 - 199 mg/dL Final         Failed - Triglycerides in normal range and within 360 days    Triglycerides  Date Value Ref Range Status  12/16/2017 165 (H) 0 - 149 mg/dL Final         Failed - Valid encounter within last 6 months    Recent Outpatient Visits          1 month ago Nausea and vomiting in adult   [facility], Meda CoffeeIrma M, MD   11 months ago Annual physical exam   [facility], Levell JulyEva N, MD   1 year ago Seasonal allergic rhinitis due to pollen   [facility], Myrle ShengKristi M, MD   1 year ago Annual physical exam   [facility], Levell JulyEva N, MD   3 years ago Annual physical exam   [facility], Steven A, MD             Passed - Last BP in normal range    BP Readings from Last 1 Encounters:  11/04/18 127/78        levothyroxine (SYNTHROID, LEVOTHROID) 112 MCG tablet [Pharmacy Med Name: LEVOTHYROXINE 0.112MG  (112MCG) TABS] 90 tablet 0   Sig: TAKE 1 TABLET BY MOUTH DAILY BEFORE BREAKFAST     Endocrinology:  Hypothyroid Agents Failed - 12/10/2018  6:13 AM      Failed - TSH needs to be rechecked within 3 months after an abnormal result. Refill until TSH is due.      Failed - Valid encounter within last 12 months    Recent Outpatient Visits          1 month ago Nausea and vomiting in adult   [facility], Meda CoffeeIrma M, MD   11 months ago Annual physical exam   [facility], Levell JulyEva N, MD   1 year ago Seasonal allergic rhinitis due to pollen   [facility], Myrle ShengKristi M, MD   1 year ago Annual physical exam   [facility], Levell JulyEva N, MD   3 years ago Annual physical exam   [facility] Maylon PeppersSteven A, MD  Passed - TSH in normal range and within 360 days    TSH  Date Value Ref Range Status  12/16/2017 1.330 0.450 - 4.500 uIU/mL Final          Requested Prescriptions  Pending Prescriptions Disp Refills   venlafaxine XR (EFFEXOR-XR) 37.5 MG 24 hr capsule [Pharmacy Med Name: VENLAFAXINE ER 37.5MG  CAPSULES] 90 capsule 0    Sig: TAKE 1 CAPSULE(37.5 MG) BY MOUTH DAILY     Psychiatry: Antidepressants - SNRI - desvenlafaxine & venlafaxine Failed - 12/10/2018  6:13 AM      Failed - LDL in normal range and within 360 days    LDL Calculated  Date Value Ref Range Status  12/16/2017 121 (H) 0 - 99 mg/dL Final         Failed - Total Cholesterol in normal range and within 360 days    Cholesterol, Total  Date Value Ref Range Status  12/16/2017 208 (H) 100 - 199 mg/dL Final         Failed - Triglycerides in normal range and within 360 days    Triglycerides  Date Value Ref Range Status  12/16/2017 165 (H) 0 - 149 mg/dL Final         Failed - Valid encounter within last 6 months    Recent Outpatient Visits          1 month ago Nausea and vomiting in adult   [facility], Meda Coffee, MD   11 months ago Annual physical exam    [facility], Levell July, MD   1 year ago Seasonal allergic rhinitis due to pollen   Primary Care at [facility], Myrle Sheng, MD   1 year ago Annual physical exam   [facility], Levell July, MD   3 years ago Annual physical exam   [facility], MD             Passed - Last BP in normal range    BP Readings from Last 1 Encounters:  11/04/18 127/78        levothyroxine (SYNTHROID, LEVOTHROID) 112 MCG tablet [Pharmacy Med Name: LEVOTHYROXINE 0.112MG  ( ) TABS] 90 tablet 0    Sig: TAKE 1 TABLET BY MOUTH DAILY BEFORE BREAKFAST     Endocrinology:  Hypothyroid Agents Failed - 12/10/2018  6:13 AM      Failed - TSH needs to be rechecked within 3 months after an abnormal result. Refill until TSH is due.      Failed - Valid encounter within last 12 months    Recent Outpatient Visits          1 month ago Nausea and vomiting in adult   [facility], Meda Coffee, MD   11 months ago Annual physical exam   [facility], Levell July, MD   1 year ago Seasonal allergic rhinitis due to pollen   [facility] Care at [facility], Myrle Sheng, MD   1 year ago Annual physical exam   [facility], Levell July, MD   3 years ago Annual physical exam   [facility], MD             Passed - TSH in normal range and within 360 days    TSH  Date Value Ref Range Status  12/16/2017 1.330 0.450 - 4.500 uIU/mL Final

## 2018-12-12 NOTE — Telephone Encounter (Signed)
Refill request please advise

## 2018-12-14 NOTE — Telephone Encounter (Signed)
I have not seen patient in 1 year and last labs were 1 year ago as well.  Sent in 7730-month supply of medications -levothyroxine and venlafaxine -but needs office visit for and thyroid labs for any further refills.  Recommend she schedule an office visit or CPE now as well need to be before 2/14 so that she does not run out of medications.

## 2018-12-15 NOTE — Telephone Encounter (Signed)
MyChart message sent to pt about making an appointment °

## 2018-12-18 ENCOUNTER — Encounter: Payer: BC Managed Care – PPO | Admitting: Family Medicine

## 2019-01-27 ENCOUNTER — Other Ambulatory Visit: Payer: Self-pay | Admitting: Obstetrics & Gynecology

## 2019-01-27 DIAGNOSIS — E2839 Other primary ovarian failure: Secondary | ICD-10-CM

## 2019-03-10 ENCOUNTER — Other Ambulatory Visit: Payer: Self-pay | Admitting: Family Medicine

## 2019-03-10 NOTE — Telephone Encounter (Signed)
Atorvastatin refill request.   Needs appt.    I called and left her a message to call us for an appt.   I also let her know Dr. Clelia Croft is no longer with the practice and would need to be established with another provider on her answering machine.  Last refill on this Rx was 12/16/17.

## 2019-03-20 ENCOUNTER — Other Ambulatory Visit: Payer: Self-pay

## 2019-03-20 ENCOUNTER — Ambulatory Visit: Payer: BC Managed Care – PPO | Admitting: Endocrinology

## 2019-03-23 ENCOUNTER — Telehealth: Payer: Self-pay | Admitting: Endocrinology

## 2019-03-23 ENCOUNTER — Other Ambulatory Visit: Payer: Self-pay

## 2019-03-23 MED ORDER — LEVOTHYROXINE SODIUM 112 MCG PO TABS: ORAL_TABLET | ORAL | 11 refills | Status: DC

## 2019-03-23 NOTE — Telephone Encounter (Signed)
Rx sent 

## 2019-03-23 NOTE — Telephone Encounter (Signed)
MEDICATION: levothyroxine (SYNTHROID, LEVOTHROID) 112 MCG tablet  PHARMACY:  [id number] - [place], [place] - [address] AT [address]  IS THIS A 90 DAY SUPPLY : 30 day   IS PATIENT OUT OF MEDICATION:  No  IF NOT; HOW MUCH IS LEFT:  8 days left.  LAST APPOINTMENT DATE: @Visit  date not found  NEXT APPOINTMENT DATE:@Visit  date not found  DO WE HAVE YOUR PERMISSION TO LEAVE A DETAILED MESSAGE:  OTHER COMMENTS:    **Let patient know to contact pharmacy at the end of the day to make sure medication is ready. **  ** Please notify patient to allow 48-72 hours to process**  **Encourage patient to contact the pharmacy for refills or they can request refills through [facility]**

## 2019-03-24 ENCOUNTER — Ambulatory Visit: Payer: BC Managed Care – PPO | Admitting: Endocrinology

## 2019-04-07 ENCOUNTER — Other Ambulatory Visit: Payer: BC Managed Care – PPO

## 2019-05-18 ENCOUNTER — Other Ambulatory Visit: Payer: Self-pay | Admitting: Family Medicine

## 2019-05-18 NOTE — Telephone Encounter (Signed)
Patient called, left VM to call the office to schedule an appointment for refills of medications.

## 2019-06-23 ENCOUNTER — Ambulatory Visit
Admission: RE | Admit: 2019-06-23 | Discharge: 2019-06-23 | Disposition: A | Discharge status: Home / Self Care | Payer: BC Managed Care – PPO | Source: Ambulatory Visit | Attending: Obstetrics & Gynecology | Admitting: Obstetrics & Gynecology

## 2019-06-23 ENCOUNTER — Other Ambulatory Visit: Payer: Self-pay

## 2019-06-23 DIAGNOSIS — E2839 Other primary ovarian failure: Secondary | ICD-10-CM

## 2019-09-28 ENCOUNTER — Other Ambulatory Visit (HOSPITAL_COMMUNITY): Payer: Self-pay | Admitting: Family Medicine

## 2019-09-28 DIAGNOSIS — R0789 Other chest pain: Secondary | ICD-10-CM

## 2019-10-01 ENCOUNTER — Encounter (HOSPITAL_COMMUNITY): Payer: Self-pay | Admitting: Family Medicine

## 2019-10-08 ENCOUNTER — Telehealth (HOSPITAL_COMMUNITY): Payer: Self-pay

## 2019-10-08 NOTE — Telephone Encounter (Signed)
New message   Just an FYI. We have made several attempts to contact this patient including sending a letter to schedule or reschedule their echocardiogram. We will be removing the patient from the echo [place].   10.80.20 @ 12:16pm lm on home vm  - Julie Nixon  10.1.20 mail reminder letter Julie Nixon  9.28.20 @ 10:00am lm on home vm - Julie Nixon

## 2019-10-09 ENCOUNTER — Other Ambulatory Visit: Payer: Self-pay | Admitting: Family Medicine

## 2019-10-09 DIAGNOSIS — Z1231 Encounter for screening mammogram for malignant neoplasm of breast: Secondary | ICD-10-CM

## 2019-10-12 ENCOUNTER — Ambulatory Visit
Admission: RE | Admit: 2019-10-12 | Discharge: 2019-10-12 | Disposition: A | Discharge status: Home / Self Care | Payer: BC Managed Care – PPO | Source: Ambulatory Visit | Attending: Family Medicine | Admitting: Family Medicine

## 2019-10-12 ENCOUNTER — Other Ambulatory Visit: Payer: Self-pay

## 2019-10-12 DIAGNOSIS — Z1231 Encounter for screening mammogram for malignant neoplasm of breast: Secondary | ICD-10-CM

## 2019-10-14 ENCOUNTER — Other Ambulatory Visit: Payer: Self-pay

## 2019-10-14 ENCOUNTER — Ambulatory Visit (HOSPITAL_COMMUNITY): Payer: BC Managed Care – PPO | Attending: Internal Medicine

## 2019-10-14 DIAGNOSIS — R0789 Other chest pain: Secondary | ICD-10-CM | POA: Diagnosis not present

## 2020-03-19 ENCOUNTER — Telehealth: Payer: Self-pay | Admitting: Endocrinology

## 2020-03-19 NOTE — Telephone Encounter (Signed)
Options: 1.  Please schedule f/u appt 2.  Then please refill x 1, pending that appt.  OR:  Please forward refill request to pt's primary care provider.  

## 2020-03-23 ENCOUNTER — Other Ambulatory Visit: Payer: Self-pay

## 2020-03-23 DIAGNOSIS — E890 Postprocedural hypothyroidism: Secondary | ICD-10-CM

## 2020-03-23 MED ORDER — LEVOTHYROXINE SODIUM 112 MCG PO TABS: 112.0000 ug | ORAL_TABLET | Freq: Every day | ORAL | 0 refills | Status: DC

## 2020-03-23 NOTE — Telephone Encounter (Signed)
Outpatient Medication Detail   Disp Refills Start End   levothyroxine (SYNTHROID) 112 MCG tablet 30 tablet 0 03/23/2020    Sig - Route: Take 1 tablet (112 mcg total) by mouth daily before breakfast. Take 1 tablet by mouth daily before breakfast. - Oral   Sent to pharmacy as: levothyroxine (SYNTHROID) 112 MCG tablet   E-Prescribing Status: Receipt confirmed by pharmacy (03/23/2020  2:28 PM EDT)

## 2020-03-23 NOTE — Telephone Encounter (Signed)
[id number] [place], [place] - [address] AT [facility] OF [address] Phone:  [phone]  Fax:  [phone]     Patient has been scheduled - requesting refill for Levothyroxine

## 2020-03-30 ENCOUNTER — Other Ambulatory Visit: Payer: Self-pay

## 2020-04-04 ENCOUNTER — Ambulatory Visit: Payer: BC Managed Care – PPO | Admitting: Endocrinology

## 2020-04-04 ENCOUNTER — Other Ambulatory Visit: Payer: Self-pay

## 2020-04-04 ENCOUNTER — Encounter: Payer: Self-pay | Admitting: Endocrinology

## 2020-04-04 VITALS — BP 140/86 | HR 75 | Ht 67.0 in | Wt 166.0 lb

## 2020-04-04 DIAGNOSIS — E890 Postprocedural hypothyroidism: Secondary | ICD-10-CM | POA: Diagnosis not present

## 2020-04-04 NOTE — Progress Notes (Signed)
Subjective:    Patient ID: Julie Nixon, female    DOB: Dec 04, 1961, 58 y.o.   MRN: [id number]  HPI Pt returns for f/u of post-RAI hypothyroidism (in late 2014, pt was dx'ed with hyperthyroidism, due to Grave's dz.  She took RAI in January of 2015.  She was rx'ed synthroid soon thereafter).  pt states she feels well in general.  She takes synthroid as rx'ed.   Past Medical History:  Diagnosis Date  . Allergy   . Anxiety   . Asthma   . Hyperlipidemia   . Thyroid disease     Past Surgical History:  Procedure Laterality Date  . MOUTH SURGERY      Social History   Socioeconomic History  . Marital status: Single    Spouse name: Not on file  . Number of children: Not on file  . Years of education: Not on file  . Highest education level: Not on file  Occupational History  . Occupation: Product manager: [employer]  . Occupation: Server  Tobacco Use  . Smoking status: Current Every Day Smoker    Packs/day: 1.00    Years: 31.00    Pack years: 31.00    Types: Cigarettes    Last attempt to quit: 08/06/2012    Years since quitting: 7.6  . Smokeless tobacco: Never Used  . Tobacco comment: trying to quit - on & off for 3 years - down to 1/2 ppd  Substance and Sexual Activity  . Alcohol use: No  . Drug use: No  . Sexual activity: Yes  Other Topics Concern  . Not on file  Social History Narrative   Divorced. Education: [employer]. Exercise: Elliptical/swim 3 times a week for 30 minutes.   Ex-husband addicted to narcotics and is anxious about becoming addicted to any type of medication   Social Determinants of Health   Financial Resource Strain:   . Difficulty of Paying Living Expenses:   Food Insecurity:   . Worried About Charity fundraiser in the Last Year:   . Arboriculturist in the Last Year:   Transportation Needs:   . Film/video editor (Medical):   Marland Kitchen Lack of Transportation (Non-Medical):   Physical Activity:   . Days of Exercise per Week:     . Minutes of Exercise per Session:   Stress:   . Feeling of Stress :   Social Connections:   . Frequency of Communication with Friends and Family:   . Frequency of Social Gatherings with Friends and Family:   . Attends Religious Services:   . Active Member of Clubs or Organizations:   . Attends Archivist Meetings:   Marland Kitchen Marital Status:   Intimate Partner Violence:   . Fear of Current or Ex-Partner:   . Emotionally Abused:   Marland Kitchen Physically Abused:   . Sexually Abused:     Current Outpatient Medications on File Prior to Visit  Medication Sig Dispense Refill  . Albuterol Sulfate (PROAIR RESPICLICK) 408 (90 Base) MCG/ACT AEPB Inhale 2 puffs into the lungs every 4 (four) hours as needed. 1 each 0  . atorvastatin (LIPITOR) 10 MG tablet TAKE 1/2 TABLET BY MOUTH DAILY 30 tablet 0  . azelastine (ASTELIN) 0.1 % nasal spray Place 1 spray into both nostrils 2 (two) times daily. Use in each nostril as directed 30 mL 11  . estrogens, conjugated, (PREMARIN) 0.45 MG tablet Take 0.45 mg by mouth daily. Take daily for 21 days  then do not take for 7 days.    . fexofenadine-pseudoephedrine (ALLEGRA-D 24) 180-240 MG per 24 hr tablet Take 1 tablet by mouth daily. 30 tablet 11  . fluticasone (FLONASE) 50 MCG/ACT nasal spray Place 2 sprays into both nostrils daily. 16 g 11  . folic acid (FOLVITE) 1 MG tablet Take 1 mg by mouth daily.    Marland Kitchen levothyroxine (SYNTHROID) 112 MCG tablet Take 1 tablet (112 mcg total) by mouth daily before breakfast. Take 1 tablet by mouth daily before breakfast. 30 tablet 0  . meloxicam (MOBIC) 15 MG tablet Take 1 tablet (15 mg total) by mouth daily as needed for pain. 30 tablet 1  . ondansetron (ZOFRAN-ODT) 8 MG disintegrating tablet Take 1 tablet (8 mg total) by mouth every 8 (eight) hours as needed for nausea. 30 tablet 0  . venlafaxine XR (EFFEXOR-XR) 37.5 MG 24 hr capsule Take 1 capsule (37.5 mg total) by mouth daily with breakfast. **NEED OFFICE VISIT FOR ANY FURTHER  REFILLS** 30 capsule 1   No current facility-administered medications on file prior to visit.    Allergies  Allergen Reactions  . Egg Yolk     Family History  Problem Relation Age of Onset  . Hypertension Mother   . Heart Problems Mother        CABG  . Hyperlipidemia Mother   . Hypertension Father   . Heart Problems Father        CABG  . Hyperlipidemia Father   . Hypertension Sister   . Heart Problems Sister   . Hypertension Sister   . Heart Problems Sister   . Hypertension Sister   . Heart Problems Sister   . Heart disease Brother        massive heart attack  . Heart disease Brother   . Hyperlipidemia Brother   . Hypertension Brother   . Breast cancer Neg Hx     BP 140/86   Pulse 75   Ht 5\' 7"  (1.702 m)   Wt 166 lb (75.3 kg)   LMP 10/27/2013   SpO2 98%   BMI 26.00 kg/m    Review of Systems     Objective:   Physical Exam VITAL SIGNS:  See vs page GENERAL: no distress NECK: There is no palpable thyroid enlargement.  No thyroid nodule is palpable.  No palpable lymphadenopathy at the anterior neck.    Lab Results  Component Value Date   TSH 2.61 04/04/2020   T4TOTAL 8.5 12/08/2015       Assessment & Plan:  HTN: is noted today Hypothyroidism: well-replaced.  Please continue the same medication   Patient Instructions  Your blood pressure is high today.  Please see your primary care provider soon, to have it rechecked Blood tests are requested for you today.  We'll let you know about the results.  It is best to never miss the medication.  However, if you do miss it, next best is to double up the next time.   Please come back for a follow-up appointment in 1 year.

## 2020-04-04 NOTE — Patient Instructions (Addendum)
Your blood pressure is high today.  Please see your primary care provider soon, to have it rechecked Blood tests are requested for you today.  We'll let you know about the results.   It is best to never miss the medication.  However, if you do miss it, next best is to double up the next time.   Please come back for a follow-up appointment in 1 year.   

## 2020-04-05 LAB — TSH: TSH: 2.61 u[IU]/mL (ref 0.35–4.50)

## 2020-04-05 LAB — T4, FREE: Free T4: 1.22 ng/dL (ref 0.60–1.60)

## 2020-04-18 ENCOUNTER — Other Ambulatory Visit: Payer: Self-pay | Admitting: Endocrinology

## 2020-04-18 DIAGNOSIS — E890 Postprocedural hypothyroidism: Secondary | ICD-10-CM

## 2020-12-26 ENCOUNTER — Other Ambulatory Visit: Payer: Self-pay | Admitting: Family Medicine

## 2020-12-26 DIAGNOSIS — Z1231 Encounter for screening mammogram for malignant neoplasm of breast: Secondary | ICD-10-CM

## 2020-12-27 ENCOUNTER — Ambulatory Visit
Admission: RE | Admit: 2020-12-27 | Discharge: 2020-12-27 | Disposition: A | Discharge status: Home / Self Care | Payer: BC Managed Care – PPO | Source: Ambulatory Visit | Attending: Family Medicine | Admitting: Family Medicine

## 2020-12-27 ENCOUNTER — Other Ambulatory Visit: Payer: Self-pay

## 2020-12-27 DIAGNOSIS — Z1231 Encounter for screening mammogram for malignant neoplasm of breast: Secondary | ICD-10-CM

## 2021-04-17 ENCOUNTER — Other Ambulatory Visit: Payer: Self-pay | Admitting: Endocrinology

## 2021-04-17 DIAGNOSIS — E890 Postprocedural hypothyroidism: Secondary | ICD-10-CM

## 2021-06-16 ENCOUNTER — Other Ambulatory Visit: Payer: Self-pay | Admitting: Endocrinology

## 2021-06-16 DIAGNOSIS — E890 Postprocedural hypothyroidism: Secondary | ICD-10-CM

## 2021-06-23 ENCOUNTER — Ambulatory Visit: Payer: BC Managed Care – PPO | Admitting: Endocrinology

## 2021-06-23 ENCOUNTER — Other Ambulatory Visit: Payer: Self-pay

## 2021-06-23 ENCOUNTER — Encounter: Payer: Self-pay | Admitting: Endocrinology

## 2021-06-23 VITALS — BP 130/80 | HR 64 | Ht 67.0 in | Wt 162.6 lb

## 2021-06-23 DIAGNOSIS — E890 Postprocedural hypothyroidism: Secondary | ICD-10-CM

## 2021-06-23 LAB — T4, FREE: Free T4: 0.97 ng/dL (ref 0.60–1.60)

## 2021-06-23 LAB — TSH: TSH: 3.06 u[IU]/mL (ref 0.35–4.50)

## 2021-06-23 NOTE — Progress Notes (Signed)
Subjective:    Patient ID: Julie Nixon, female    DOB: November 29, 1961, 60 y.o.   MRN: [id number]  HPI Pt returns for f/u of post-RAI hypothyroidism (in late 2014, pt was dx'ed with hyperthyroidism, due to Grave's dz.  She took RAI in January of 2015.  She was rx'ed synthroid).  pt states she feels well in general.  She takes synthroid as rx'ed.   Past Medical History:  Diagnosis Date   Allergy    Anxiety    Asthma    Hyperlipidemia    Thyroid disease     Past Surgical History:  Procedure Laterality Date   MOUTH SURGERY      Social History   Socioeconomic History   Marital status: Single    Spouse name: Not on file   Number of children: Not on file   Years of education: Not on file   Highest education level: Not on file  Occupational History   Occupation: Runner, broadcasting/film/video    Employer: [employer]   Occupation: Server  Tobacco Use   Smoking status: Every Day    Packs/day: 1.00    Years: 31.00    Pack years: 31.00    Types: Cigarettes    Last attempt to quit: 08/06/2012    Years since quitting: 8.8   Smokeless tobacco: Never   Tobacco comments:    trying to quit - on & off for 3 years - down to 1/2 ppd  Substance and Sexual Activity   Alcohol use: No   Drug use: No   Sexual activity: Yes  Other Topics Concern   Not on file  Social History Narrative   Divorced. Education: [employer]. Exercise: Elliptical/swim 3 times a week for 30 minutes.   Ex-husband addicted to narcotics and is anxious about becoming addicted to any type of medication   Social Determinants of Health   Financial Resource Strain: Not on file  Food Insecurity: Not on file  Transportation Needs: Not on file  Physical Activity: Not on file  Stress: Not on file  Social Connections: Not on file  Intimate Partner Violence: Not on file    Current Outpatient Medications on File Prior to Visit  Medication Sig Dispense Refill   Albuterol Sulfate (PROAIR RESPICLICK) 108 (90 Base) MCG/ACT AEPB  Inhale 2 puffs into the lungs every 4 (four) hours as needed. 1 each 0   atorvastatin (LIPITOR) 10 MG tablet TAKE 1/2 TABLET BY MOUTH DAILY 30 tablet 0   azelastine (ASTELIN) 0.1 % nasal spray Place 1 spray into both nostrils 2 (two) times daily. Use in each nostril as directed 30 mL 11   estrogens, conjugated, (PREMARIN) 0.45 MG tablet Take 0.45 mg by mouth daily. Take daily for 21 days then do not take for 7 days.     fexofenadine-pseudoephedrine (ALLEGRA-D 24) 180-240 MG per 24 hr tablet Take 1 tablet by mouth daily. 30 tablet 11   fluticasone (FLONASE) 50 MCG/ACT nasal spray Place 2 sprays into both nostrils daily. 16 g 11   folic acid (FOLVITE) 1 MG tablet Take 1 mg by mouth daily.     meloxicam (MOBIC) 15 MG tablet Take 1 tablet (15 mg total) by mouth daily as needed for pain. 30 tablet 1   ondansetron (ZOFRAN-ODT) 8 MG disintegrating tablet Take 1 tablet (8 mg total) by mouth every 8 (eight) hours as needed for nausea. 30 tablet 0   venlafaxine XR (EFFEXOR-XR) 37.5 MG 24 hr capsule Take 1 capsule (37.5 mg total) by  mouth daily with breakfast. **NEED OFFICE VISIT FOR ANY FURTHER REFILLS** 30 capsule 1   No current facility-administered medications on file prior to visit.    Allergies  Allergen Reactions   Egg Yolk     Family History  Problem Relation Age of Onset   Hypertension Mother    Heart Problems Mother        CABG   Hyperlipidemia Mother    Hypertension Father    Heart Problems Father        CABG   Hyperlipidemia Father    Hypertension Sister    Heart Problems Sister    Hypertension Sister    Heart Problems Sister    Hypertension Sister    Heart Problems Sister    Heart disease Brother        massive heart attack   Heart disease Brother    Hyperlipidemia Brother    Hypertension Brother    Breast cancer Neg Hx     BP 130/80 (BP Location: Right Arm, Patient Position: Sitting, Cuff Size: Normal)   Pulse 64   Ht 5\' 7"  (1.702 m)   Wt 162 lb 9.6 oz (73.8 kg)    LMP 10/27/2013   SpO2 97%   BMI 25.47 kg/m    Review of Systems     Objective:   Physical Exam NECK: There is no palpable thyroid enlargement.  No thyroid nodule is palpable.  No palpable lymphadenopathy at the anterior neck.    Lab Results  Component Value Date   TSH 3.06 06/23/2021   T4TOTAL 8.5 12/08/2015       Assessment & Plan:  Hypothyroidism: well-controlled.  Please continue the same synthroid.

## 2021-06-23 NOTE — Patient Instructions (Addendum)
Blood tests are requested for you today.  We'll let you know about the results.  It is best to never miss the medication.  However, if you do miss it, next best is to double up the next time.  Please come back for a follow-up appointment in 1 year.   

## 2021-06-24 MED ORDER — LEVOTHYROXINE SODIUM 112 MCG PO TABS: ORAL_TABLET | ORAL | 3 refills | Status: DC

## 2021-12-26 ENCOUNTER — Other Ambulatory Visit: Payer: Self-pay | Admitting: Family Medicine

## 2021-12-26 DIAGNOSIS — Z1231 Encounter for screening mammogram for malignant neoplasm of breast: Secondary | ICD-10-CM

## 2021-12-28 ENCOUNTER — Ambulatory Visit
Admission: RE | Admit: 2021-12-28 | Discharge: 2021-12-28 | Disposition: A | Discharge status: Home / Self Care | Payer: BC Managed Care – PPO | Source: Ambulatory Visit | Attending: Family Medicine | Admitting: Family Medicine

## 2021-12-28 DIAGNOSIS — Z1231 Encounter for screening mammogram for malignant neoplasm of breast: Secondary | ICD-10-CM

## 2022-03-05 ENCOUNTER — Other Ambulatory Visit: Payer: Self-pay

## 2022-03-05 ENCOUNTER — Ambulatory Visit: Payer: BC Managed Care – PPO | Admitting: Endocrinology

## 2022-03-05 VITALS — BP 130/80 | HR 73 | Ht 67.0 in | Wt 156.8 lb

## 2022-03-05 DIAGNOSIS — E890 Postprocedural hypothyroidism: Secondary | ICD-10-CM | POA: Diagnosis not present

## 2022-03-05 LAB — TSH: TSH: 0.2 u[IU]/mL — ABNORMAL LOW (ref 0.35–5.50)

## 2022-03-05 LAB — T4, FREE: Free T4: 1.33 ng/dL (ref 0.60–1.60)

## 2022-03-05 MED ORDER — LEVOTHYROXINE SODIUM 112 MCG PO TABS: 112.0000 ug | ORAL_TABLET | Freq: Every day | ORAL | 3 refills | Status: AC

## 2022-03-05 NOTE — Patient Instructions (Signed)
Blood tests are requested for you today.  We'll let you know about the results.  It is best to never miss the medication.  However, if you do miss it, next best is to double up the next time.  Please come back for a follow-up appointment in 1 year.   

## 2022-03-05 NOTE — Progress Notes (Signed)
? ?Subjective:  ? ? Patient ID: Julie Nixon, female    DOB: 1961/09/02, 61 y.o.   MRN: [id number] ? ?HPI ?Pt returns for f/u of post-RAI hypothyroidism (in late 2014, pt was dx'ed with hyperthyroidism, due to Dixon dz.  She took RAI in 2015; she was rx'ed synthroid).  synthroid was increased to 125/d, based on TSH of 6 in 10/22.  12/22, TSH was 0.28.  pt states she feels well in general.   ?Past Medical History:  ?Diagnosis Date  ? Allergy   ? Anxiety   ? Asthma   ? Hyperlipidemia   ? Thyroid disease   ? ? ?Past Surgical History:  ?Procedure Laterality Date  ? MOUTH SURGERY    ? ? ?Social History  ? ?Socioeconomic History  ? Marital status: Single  ?  Spouse name: Not on file  ? Number of children: Not on file  ? Years of education: Not on file  ? Highest education level: Not on file  ?Occupational History  ? Occupation: Pharmacist, hospital  ?  Employer: [employer]  ? Occupation: Server  ?Tobacco Use  ? Smoking status: Every Day  ?  Packs/day: 1.00  ?  Years: 31.00  ?  Pack years: 31.00  ?  Types: Cigarettes  ?  Last attempt to quit: 08/06/2012  ?  Years since quitting: 9.5  ? Smokeless tobacco: Never  ? Tobacco comments:  ?  trying to quit - on & off for 3 years - down to 1/2 ppd  ?Substance and Sexual Activity  ? Alcohol use: No  ? Drug use: No  ? Sexual activity: Yes  ?Other Topics Concern  ? Not on file  ?Social History Narrative  ? Divorced. Education: [employer]. Exercise: Elliptical/swim 3 times a week for 30 minutes.  ? Ex-husband addicted to narcotics and is anxious about becoming addicted to any type of medication  ? ?Social Determinants of Health  ? ?Financial Resource Strain: Not on file  ?Food Insecurity: Not on file  ?Transportation Needs: Not on file  ?Physical Activity: Not on file  ?Stress: Not on file  ?Social Connections: Not on file  ?Intimate Partner Violence: Not on file  ? ? ?Current Outpatient Medications on File Prior to Visit  ?Medication Sig Dispense Refill  ? Albuterol Sulfate (PROAIR  RESPICLICK) 123XX123 (90 Base) MCG/ACT AEPB Inhale 2 puffs into the lungs every 4 (four) hours as needed. 1 each 0  ? atorvastatin (LIPITOR) 10 MG tablet TAKE 1/2 TABLET BY MOUTH DAILY 30 tablet 0  ? azelastine (ASTELIN) 0.1 % nasal spray Place 1 spray into both nostrils 2 (two) times daily. Use in each nostril as directed 30 mL 11  ? estrogens, conjugated, (PREMARIN) 0.45 MG tablet Take 0.45 mg by mouth daily. Take daily for 21 days then do not take for 7 days.    ? fexofenadine-pseudoephedrine (ALLEGRA-D 24) 180-240 MG per 24 hr tablet Take 1 tablet by mouth daily. 30 tablet 11  ? fluticasone (FLONASE) 50 MCG/ACT nasal spray Place 2 sprays into both nostrils daily. 16 g 11  ? folic acid (FOLVITE) 1 MG tablet Take 1 mg by mouth daily.    ? meloxicam (MOBIC) 15 MG tablet Take 1 tablet (15 mg total) by mouth daily as needed for pain. 30 tablet 1  ? ondansetron (ZOFRAN-ODT) 8 MG disintegrating tablet Take 1 tablet (8 mg total) by mouth every 8 (eight) hours as needed for nausea. 30 tablet 0  ? venlafaxine XR (EFFEXOR-XR) 37.5 MG 24  hr capsule Take 1 capsule (37.5 mg total) by mouth daily with breakfast. **NEED OFFICE VISIT FOR ANY FURTHER REFILLS** 30 capsule 1  ? ?No current facility-administered medications on file prior to visit.  ? ? ?Allergies  ?Allergen Reactions  ? Egg Yolk   ? ? ?Family History  ?Problem Relation Age of Onset  ? Hypertension Mother   ? Heart Problems Mother   ?     CABG  ? Hyperlipidemia Mother   ? Hypertension Father   ? Heart Problems Father   ?     CABG  ? Hyperlipidemia Father   ? Hypertension Sister   ? Heart Problems Sister   ? Hypertension Sister   ? Heart Problems Sister   ? Hypertension Sister   ? Heart Problems Sister   ? Heart disease Brother   ?     massive heart attack  ? Heart disease Brother   ? Hyperlipidemia Brother   ? Hypertension Brother   ? Breast cancer Neg Hx   ? ? ?BP 130/80   Pulse 73   Ht 5\' 7"  (1.702 m)   Wt 156 lb 12.8 oz (71.1 kg)   LMP 10/27/2013   SpO2 96%   BMI  24.56 kg/m?  ? ? ?Review of Systems ? ?   ?Objective:  ? Physical Exam ?VITAL SIGNS:  See vs page ?GENERAL: no distress ?NECK: There is no palpable thyroid enlargement.  No thyroid nodule is palpable.  No palpable lymphadenopathy at the anterior neck.   ? ? ?Lab Results  ?Component Value Date  ? TSH 0.20 (L) 03/05/2022  ? T4TOTAL 8.5 12/08/2015  ? ?   ?Assessment & Plan:  ?Hypothyroidism: overcontrolled.  I have sent a prescription to your pharmacy, to reduce synthroid.   ? ?

## 2022-06-29 ENCOUNTER — Ambulatory Visit: Payer: BC Managed Care – PPO | Admitting: Endocrinology

## 2022-07-30 IMAGING — MG MM DIGITAL SCREENING BILAT W/ TOMO AND CAD
8 series · 9 of 24 positions shown · non-contrast
Comparison: Previous exam(s).

CLINICAL DATA: Screening.

EXAM:
DIGITAL SCREENING BILATERAL MAMMOGRAM WITH TOMOSYNTHESIS AND CAD
TECHNIQUE: Bilateral screening digital craniocaudal and mediolateral oblique
mammograms were obtained. Bilateral screening digital breast
tomosynthesis was performed. The images were evaluated with
computer-aided detection.

[R CC synth-2D]
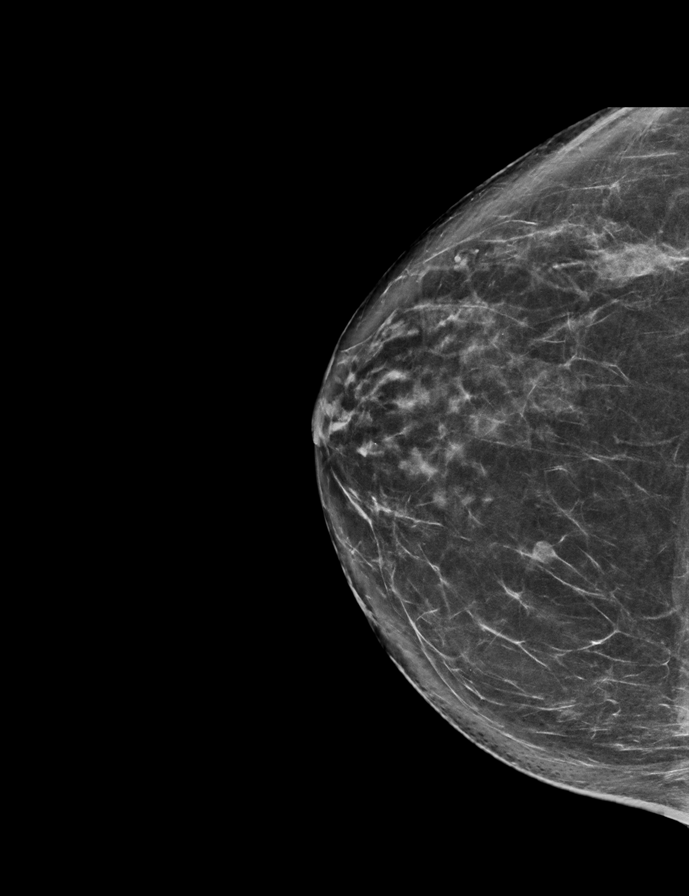

[L CC synth-2D]
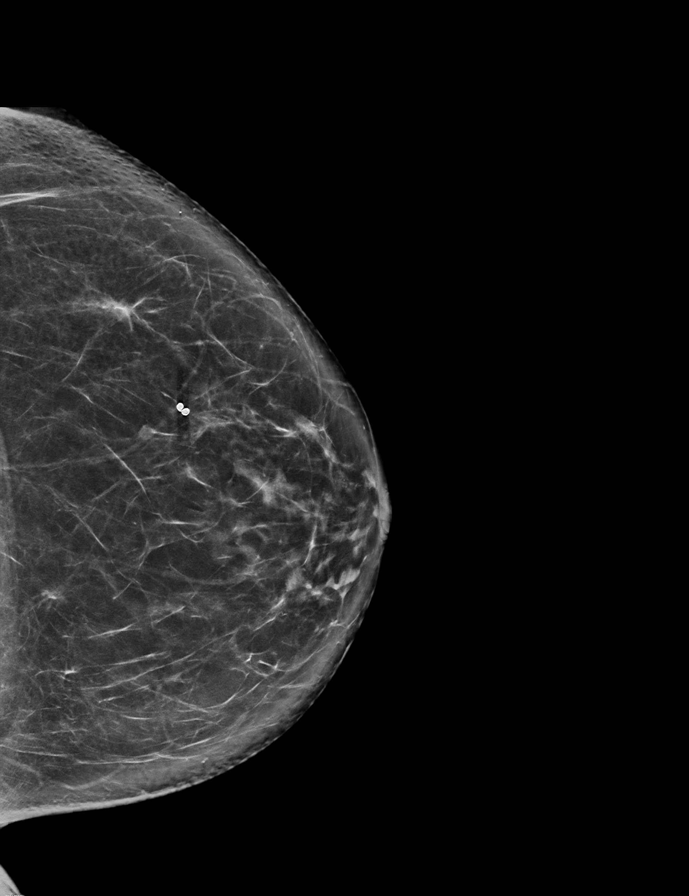

[R MLO synth-2D]
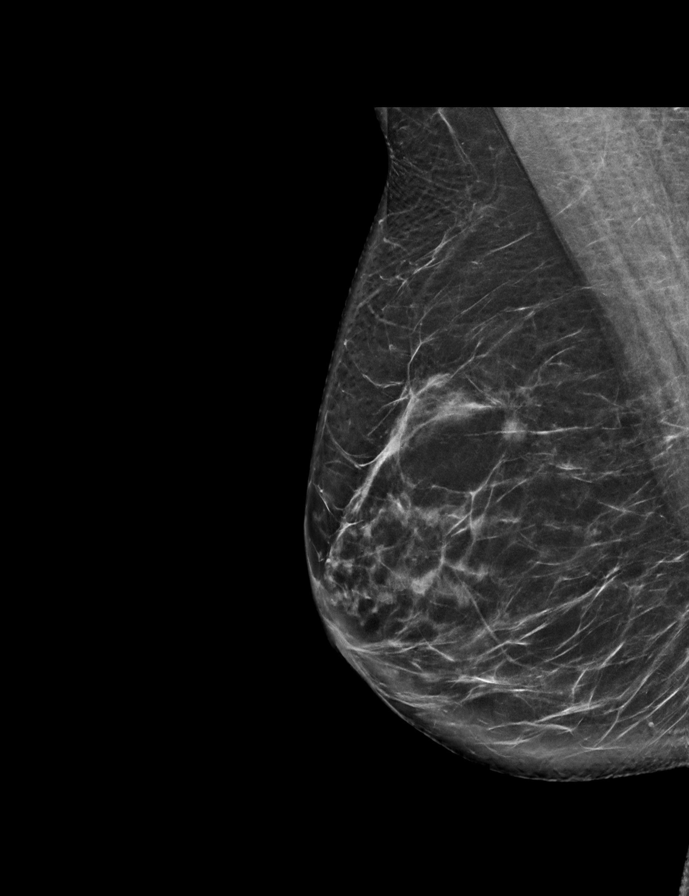

[L MLO synth-2D]
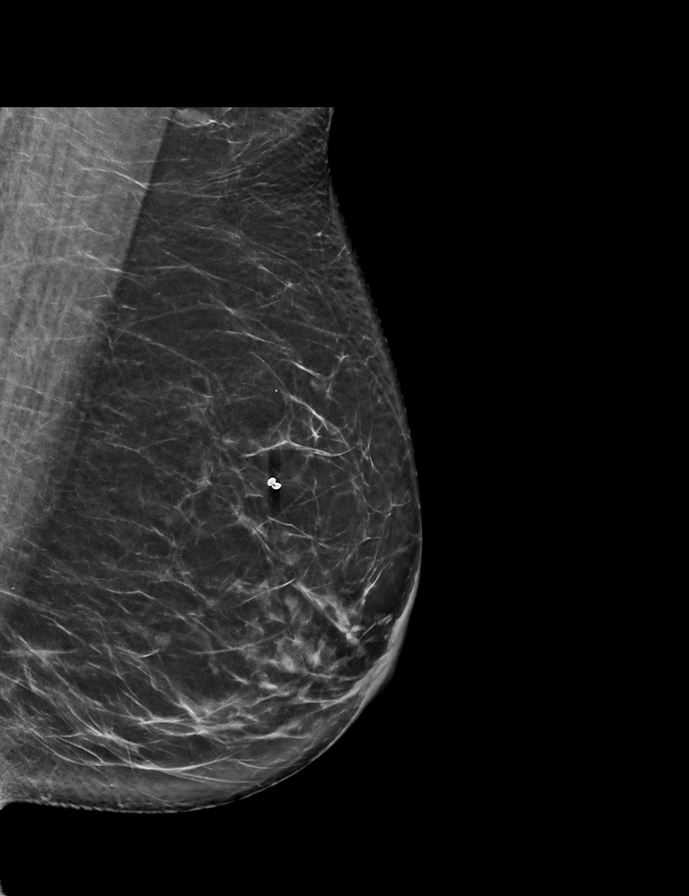

[L MLO tomo · 2 of 75 frames shown]
[frame 25/75]
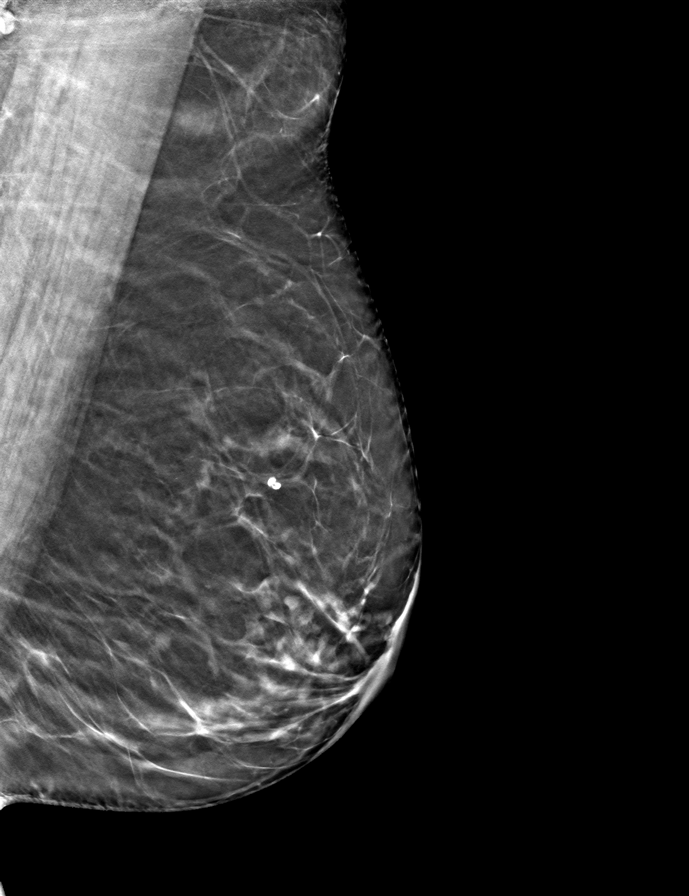
[frame 38/75]
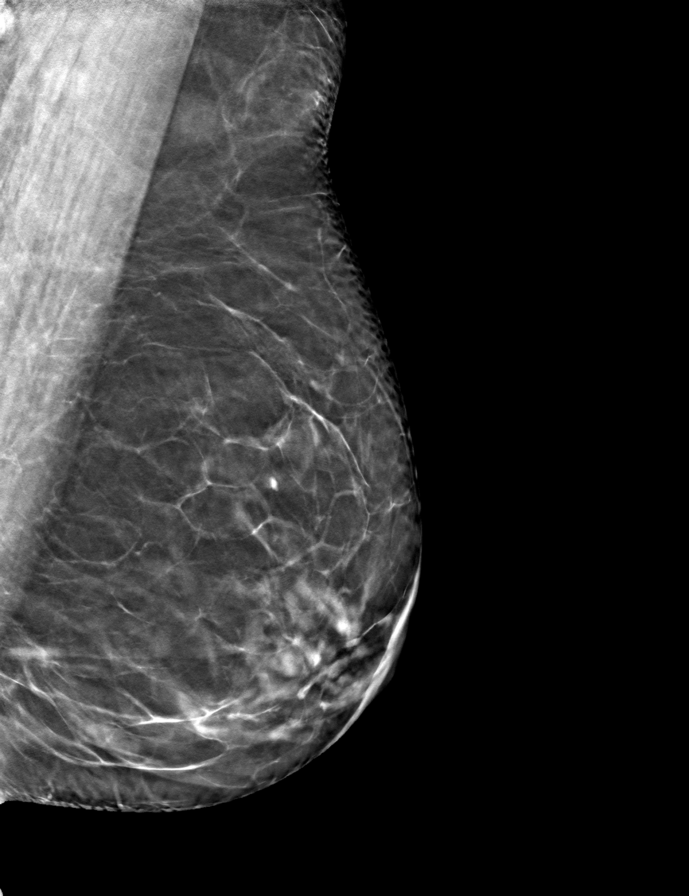

[L CC tomo · tomo slice 39/77.0]
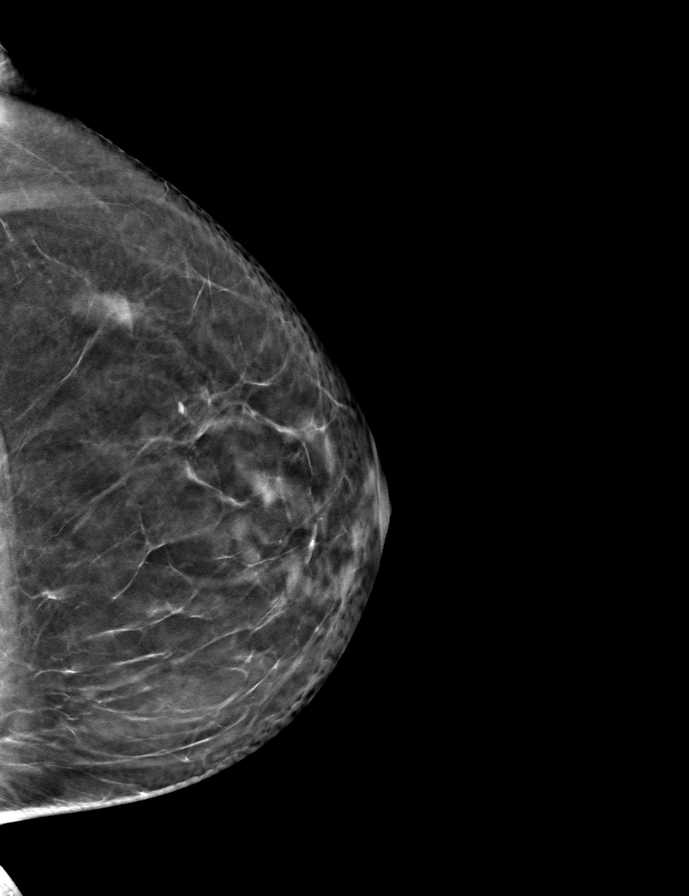

[R CC tomo · tomo slice 39/77.0]
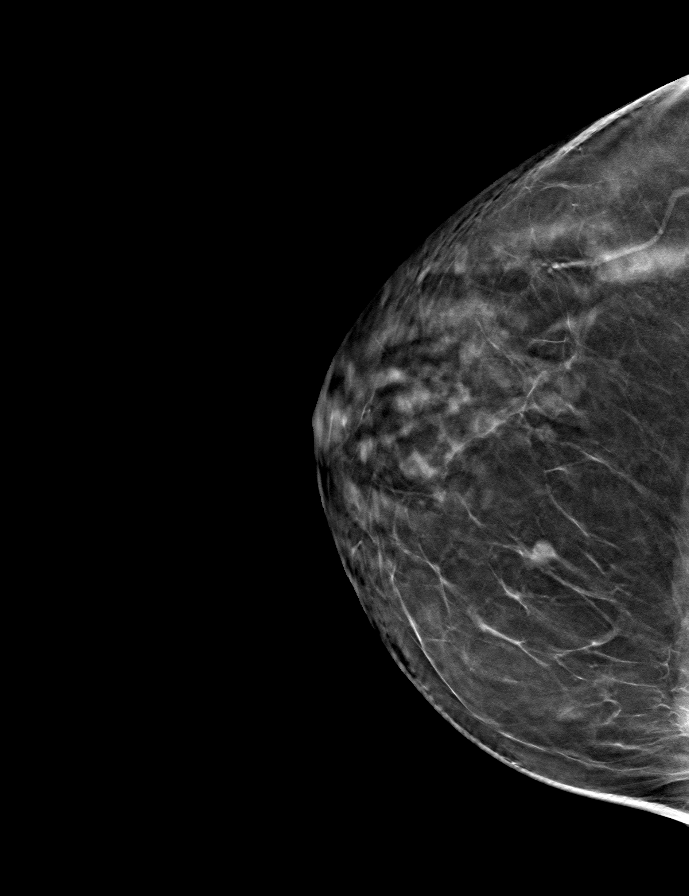

[R MLO tomo · tomo slice 42/83.0]
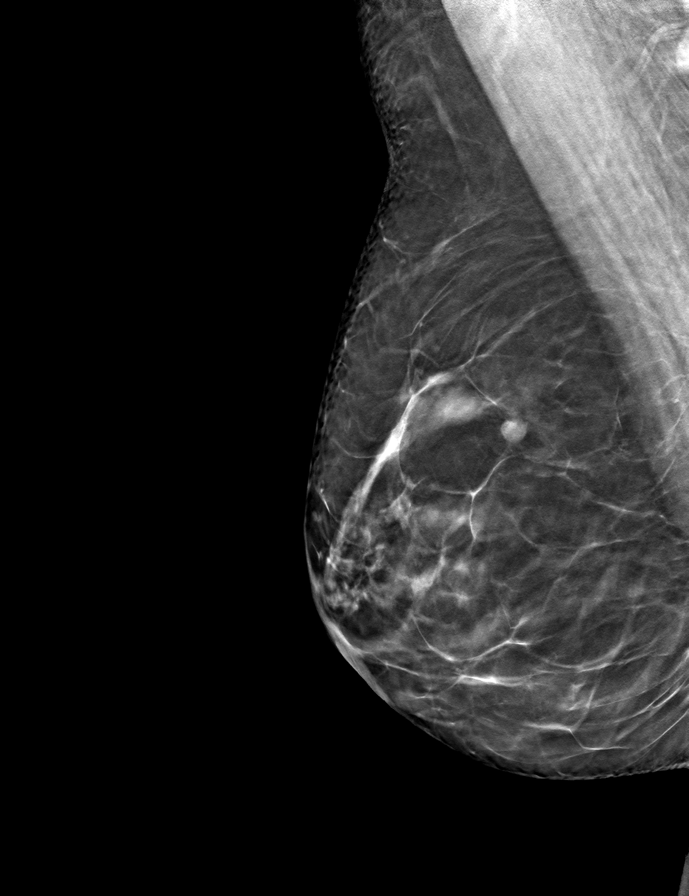

[9 of 24 positions shown; findings below may reference images not displayed]

ACR Breast Density Category b: There are scattered areas of
fibroglandular density.
FINDINGS: There are no findings suspicious for malignancy.
IMPRESSION: No mammographic evidence of malignancy. A result letter of this
screening mammogram will be mailed directly to the patient.

RECOMMENDATION:
Screening mammogram in one year. (Code:51-O-LD2)

BI-RADS CATEGORY  1: Negative.

## 2023-02-11 ENCOUNTER — Other Ambulatory Visit: Payer: Self-pay | Admitting: Family Medicine

## 2023-02-11 DIAGNOSIS — Z1231 Encounter for screening mammogram for malignant neoplasm of breast: Secondary | ICD-10-CM

## 2023-03-26 ENCOUNTER — Ambulatory Visit
Admission: RE | Admit: 2023-03-26 | Discharge: 2023-03-26 | Disposition: A | Discharge status: Home / Self Care | Payer: BC Managed Care – PPO | Source: Ambulatory Visit | Attending: Family Medicine | Admitting: Family Medicine

## 2023-03-26 DIAGNOSIS — Z1231 Encounter for screening mammogram for malignant neoplasm of breast: Secondary | ICD-10-CM

## 2023-05-16 ENCOUNTER — Other Ambulatory Visit: Payer: Self-pay

## 2023-05-16 DIAGNOSIS — E890 Postprocedural hypothyroidism: Secondary | ICD-10-CM

## 2023-05-16 NOTE — Telephone Encounter (Signed)
Previous Julie Nixon All patient, no upcoming appts scheduled. Ok to refill under you? If so 30 days?

## 2024-01-21 ENCOUNTER — Telehealth (INDEPENDENT_AMBULATORY_CARE_PROVIDER_SITE_OTHER): Payer: Self-pay | Admitting: Otolaryngology

## 2024-01-21 NOTE — Telephone Encounter (Signed)
LVM to confirm appt & location [phone] afm

## 2024-01-22 ENCOUNTER — Ambulatory Visit (INDEPENDENT_AMBULATORY_CARE_PROVIDER_SITE_OTHER): Payer: 59 | Admitting: Otolaryngology

## 2024-01-22 ENCOUNTER — Encounter (INDEPENDENT_AMBULATORY_CARE_PROVIDER_SITE_OTHER): Payer: Self-pay

## 2024-01-22 VITALS — BP 153/79 | HR 70 | Ht 66.5 in | Wt 164.0 lb

## 2024-01-22 DIAGNOSIS — H608X3 Other otitis externa, bilateral: Secondary | ICD-10-CM

## 2024-01-22 DIAGNOSIS — H6123 Impacted cerumen, bilateral: Secondary | ICD-10-CM

## 2024-01-22 DIAGNOSIS — H903 Sensorineural hearing loss, bilateral: Secondary | ICD-10-CM

## 2024-01-22 NOTE — Progress Notes (Signed)
Patient ID: Julie Nixon, female   DOB: 01/09/1961, 63 y.o.   MRN: [id number]  Follow-up: Chronic eczematous otitis externa, recurrent cerumen impaction, hearing loss  HPI: The patient is a 63 year old female who returns today for her follow-up evaluation.  The patient was last seen 6 months ago.  At that time, she was complaining of chronic itchy and weepy sensation in her ears.  She was noted to have bilateral eczematous otitis externa.  The patient also has a history of recurrent cerumen impaction.  At her last visit, she was noted to have bilateral high-frequency sensorineural hearing loss, likely secondary to presbycusis.  The patient returns today reporting significant improvement in her eczematous otitis externa.  The itchy sensation is under control with the use of Elocon cream.  She has not noted any recent change in her hearing.  Exam: General: Communicates without difficulty, well nourished, no acute distress. Head: Normocephalic, no evidence injury, no tenderness, facial buttresses intact without stepoff. Face/sinus: No tenderness to palpation and percussion. Facial movement is normal and symmetric. Eyes: PERRL, EOMI. No scleral icterus, conjunctivae clear. Neuro: CN II exam reveals vision grossly intact.  No nystagmus at any point of gaze. Ears: Auricles well formed without lesions.  Eczematous changes are noted at the entrance to the ear canals.  EAC: Bilateral cerumen impaction.  Under the operating microscope, the cerumen is carefully removed with a combination of cerumen currette, alligator forceps, and suction catheters.  After the cerumen is removed, the TMs are noted to be normal.  Nose: External evaluation reveals normal support and skin without lesions.  Dorsum is intact.  Anterior rhinoscopy reveals congested mucosa over anterior aspect of inferior turbinates and intact septum.  No purulence noted. Oral:  Oral cavity and oropharynx are intact, symmetric, without erythema or edema.   Mucosa is moist without lesions. Neck: Full range of motion without pain.  There is no significant lymphadenopathy.  No masses palpable.  Thyroid bed within normal limits to palpation.  Parotid glands and submandibular glands equal bilaterally without mass.  Trachea is midline. Neuro:  CN 2-12 grossly intact.   Assessment: 1.  Bilateral chronic eczematous otitis externa.  Her symptoms are currently under control with the use of Elocon cream. 2.  Bilateral cerumen impaction.  After the disimpaction procedure, both tympanic membranes and middle ear spaces are noted to be normal. 3.  Subjectively stable bilateral high-frequency sensorineural hearing loss.  Plan: 1.  Otomicroscopy with bilateral cerumen disimpaction. 2.  The physical exam findings are reviewed with the patient. 3.  Continue the use of Elocon cream to treat the eczematous otitis externa. 4.  The patient will return for reevaluation in 6 months.

## 2024-04-14 ENCOUNTER — Other Ambulatory Visit: Payer: Self-pay | Admitting: Family Medicine

## 2024-04-14 DIAGNOSIS — Z1231 Encounter for screening mammogram for malignant neoplasm of breast: Secondary | ICD-10-CM

## 2024-04-17 ENCOUNTER — Ambulatory Visit
Admission: RE | Admit: 2024-04-17 | Discharge: 2024-04-17 | Disposition: A | Discharge status: Home / Self Care | Source: Ambulatory Visit | Attending: Family Medicine | Admitting: Family Medicine

## 2024-04-17 DIAGNOSIS — Z1231 Encounter for screening mammogram for malignant neoplasm of breast: Secondary | ICD-10-CM

## 2024-07-24 ENCOUNTER — Encounter (INDEPENDENT_AMBULATORY_CARE_PROVIDER_SITE_OTHER): Payer: Self-pay | Admitting: Otolaryngology

## 2024-07-24 ENCOUNTER — Ambulatory Visit (INDEPENDENT_AMBULATORY_CARE_PROVIDER_SITE_OTHER): Payer: 59 | Admitting: Otolaryngology

## 2024-07-24 VITALS — BP 125/75 | HR 60

## 2024-07-24 DIAGNOSIS — H903 Sensorineural hearing loss, bilateral: Secondary | ICD-10-CM

## 2024-07-24 DIAGNOSIS — H608X3 Other otitis externa, bilateral: Secondary | ICD-10-CM | POA: Diagnosis not present

## 2024-07-24 DIAGNOSIS — H6123 Impacted cerumen, bilateral: Secondary | ICD-10-CM

## 2024-07-25 NOTE — Progress Notes (Signed)
 Patient ID: Julie Nixon, female   DOB: 1961-12-02, 63 y.o.   MRN: [id number]  Follow-up: Chronic eczematous otitis externa, hearing loss  HPI: The patient is a 63 year old female who returns today for her follow-up evaluation.  The patient has a history of chronic eczematous otitis externa and bilateral high-frequency sensorineural hearing loss.  She was treated with Elocon cream.  The patient returns today reporting intermittent itchy sensation in her ears.  The medication has helped.  She denies any recent change in her hearing.  Currently she denies any otalgia, otorrhea, or vertigo.  Exam: General: Communicates without difficulty, well nourished, no acute distress. Head: Normocephalic, no evidence injury, no tenderness, facial buttresses intact without stepoff. Face/sinus: No tenderness to palpation and percussion. Facial movement is normal and symmetric. Eyes: PERRL, EOMI. No scleral icterus, conjunctivae clear. Neuro: CN II exam reveals vision grossly intact.  No nystagmus at any point of gaze. Ears: Auricles well formed without lesions.  Bilateral cerumen impaction.  Nose: External evaluation reveals normal support and skin without lesions.  Dorsum is intact.  Anterior rhinoscopy reveals congested mucosa over anterior aspect of inferior turbinates and intact septum.  No purulence noted. Oral:  Oral cavity and oropharynx are intact, symmetric, without erythema or edema.  Mucosa is moist without lesions. Neck: Full range of motion without pain.  There is no significant lymphadenopathy.  No masses palpable.  Thyroid  bed within normal limits to palpation.  Parotid glands and submandibular glands equal bilaterally without mass.  Trachea is midline. Neuro:  CN 2-12 grossly intact.   Procedure: Bilateral cerumen disimpaction Anesthesia: None Description: Under the operating microscope, the cerumen is carefully removed with a combination of cerumen currette, alligator forceps, and suction catheters.   After the cerumen is removed, the TMs are noted to be normal.  Eczematous changes are noted within the ear canals.  No mass, erythema, or lesions. The patient tolerated the procedure well.    Assessment: 1.  Bilateral chronic eczematous otitis externa. 2.  Subjectively stable bilateral high-frequency sensorineural hearing loss. 3.  Bilateral recurrent cerumen impaction.  Both tympanic membranes and middle ear spaces are otherwise normal.  Plan: 1.  Otomicroscopy with bilateral cerumen disimpaction. 2.  The physical exam findings are reviewed with the patient. 3.  Elocon cream as needed to treat the chronic eczematous otitis externa. 4.  The patient will return for reevaluation in 6 months.

## 2025-01-29 ENCOUNTER — Ambulatory Visit (INDEPENDENT_AMBULATORY_CARE_PROVIDER_SITE_OTHER): Admitting: Otolaryngology

## 2025-03-12 ENCOUNTER — Ambulatory Visit (INDEPENDENT_AMBULATORY_CARE_PROVIDER_SITE_OTHER): Admitting: Otolaryngology
